# Patient Record
Sex: Male | Born: 1970 | Race: White | Hispanic: No | Marital: Single | Smoking: Current every day smoker
Health system: Southern US, Community
[De-identification: ages and names within clinical notes are randomized; demographics above are authoritative.]

## PROBLEM LIST (undated history)

## (undated) ENCOUNTER — Telehealth

## (undated) ENCOUNTER — Encounter

## (undated) ENCOUNTER — Telehealth: Attending: Clinical | Primary: Clinical

## (undated) ENCOUNTER — Ambulatory Visit: Payer: MEDICAID | Attending: Family | Primary: Family

## (undated) ENCOUNTER — Ambulatory Visit

## (undated) ENCOUNTER — Ambulatory Visit: Payer: MEDICAID

## (undated) ENCOUNTER — Encounter: Attending: Sports Medicine | Primary: Sports Medicine

## (undated) ENCOUNTER — Ambulatory Visit: Payer: MEDICAID | Attending: Sports Medicine | Primary: Sports Medicine

## (undated) ENCOUNTER — Telehealth
Attending: Pharmacist Clinician (PhC)/ Clinical Pharmacy Specialist | Primary: Pharmacist Clinician (PhC)/ Clinical Pharmacy Specialist

## (undated) ENCOUNTER — Encounter: Attending: Family | Primary: Family

## (undated) ENCOUNTER — Encounter: Attending: Physical Medicine & Rehabilitation | Primary: Physical Medicine & Rehabilitation

## (undated) ENCOUNTER — Telehealth: Attending: Physical Medicine & Rehabilitation | Primary: Physical Medicine & Rehabilitation

## (undated) ENCOUNTER — Ambulatory Visit
Attending: Pharmacist Clinician (PhC)/ Clinical Pharmacy Specialist | Primary: Pharmacist Clinician (PhC)/ Clinical Pharmacy Specialist

## (undated) ENCOUNTER — Telehealth: Attending: Sports Medicine | Primary: Sports Medicine

## (undated) ENCOUNTER — Telehealth: Attending: Family | Primary: Family

## (undated) ENCOUNTER — Ambulatory Visit: Payer: MEDICAID | Attending: Gastroenterology | Primary: Gastroenterology

## (undated) ENCOUNTER — Ambulatory Visit: Payer: MEDICAID | Attending: Family Medicine | Primary: Family Medicine

## (undated) DIAGNOSIS — J449 Chronic obstructive pulmonary disease, unspecified: Secondary | ICD-10-CM

## (undated) DIAGNOSIS — F209 Schizophrenia, unspecified: Secondary | ICD-10-CM

## (undated) DIAGNOSIS — M542 Cervicalgia: Secondary | ICD-10-CM

## (undated) DIAGNOSIS — F419 Anxiety disorder, unspecified: Secondary | ICD-10-CM

## (undated) DIAGNOSIS — F39 Unspecified mood [affective] disorder: Secondary | ICD-10-CM

## (undated) DIAGNOSIS — F1320 Sedative, hypnotic or anxiolytic dependence, uncomplicated: Secondary | ICD-10-CM

## (undated) DIAGNOSIS — K219 Gastro-esophageal reflux disease without esophagitis: Secondary | ICD-10-CM

## (undated) DIAGNOSIS — G894 Chronic pain syndrome: Secondary | ICD-10-CM

## (undated) DIAGNOSIS — F329 Major depressive disorder, single episode, unspecified: Secondary | ICD-10-CM

## (undated) DIAGNOSIS — F4310 Post-traumatic stress disorder, unspecified: Secondary | ICD-10-CM

## (undated) DIAGNOSIS — D1030 Benign neoplasm of unspecified part of mouth: Secondary | ICD-10-CM

## (undated) DIAGNOSIS — L905 Scar conditions and fibrosis of skin: Secondary | ICD-10-CM

## (undated) DIAGNOSIS — I10 Essential (primary) hypertension: Secondary | ICD-10-CM

## (undated) DIAGNOSIS — H5210 Myopia, unspecified eye: Secondary | ICD-10-CM

## (undated) DIAGNOSIS — F1210 Cannabis abuse, uncomplicated: Secondary | ICD-10-CM

## (undated) DIAGNOSIS — H52229 Regular astigmatism, unspecified eye: Secondary | ICD-10-CM

## (undated) DIAGNOSIS — F319 Bipolar disorder, unspecified: Secondary | ICD-10-CM

## (undated) DIAGNOSIS — M47812 Spondylosis without myelopathy or radiculopathy, cervical region: Secondary | ICD-10-CM

## (undated) DIAGNOSIS — T50902A Poisoning by unspecified drugs, medicaments and biological substances, intentional self-harm, initial encounter: Secondary | ICD-10-CM

## (undated) DIAGNOSIS — F1410 Cocaine abuse, uncomplicated: Secondary | ICD-10-CM

## (undated) HISTORY — DX: Scar conditions and fibrosis of skin: L90.5

## (undated) HISTORY — DX: Benign neoplasm of unspecified part of mouth: D10.30

## (undated) HISTORY — DX: Chronic pain syndrome: G89.4

## (undated) HISTORY — DX: Gastro-esophageal reflux disease without esophagitis: K21.9

## (undated) HISTORY — PX: HERNIA REPAIR: SHX51

## (undated) HISTORY — DX: Chronic obstructive pulmonary disease, unspecified: J44.9

## (undated) HISTORY — DX: Cocaine abuse, uncomplicated: F14.10

## (undated) HISTORY — DX: Cannabis abuse, uncomplicated: F12.10

## (undated) HISTORY — DX: Spondylosis without myelopathy or radiculopathy, cervical region: M47.812

## (undated) HISTORY — DX: Major depressive disorder, single episode, unspecified: F32.9

## (undated) HISTORY — DX: Cervicalgia: M54.2

## (undated) HISTORY — DX: Myopia, unspecified eye: H52.10

## (undated) HISTORY — DX: Poisoning by unspecified drugs, medicaments and biological substances, intentional self-harm, initial encounter: T50.902A

## (undated) HISTORY — PX: ARM DEBRIDEMENT: SHX890

## (undated) HISTORY — DX: Unspecified mood (affective) disorder: F39

## (undated) HISTORY — DX: Sedative, hypnotic or anxiolytic dependence, uncomplicated: F13.20

## (undated) HISTORY — DX: Regular astigmatism, unspecified eye: H52.229

## (undated) HISTORY — PX: TYMPANOPLASTY: SHX33

---
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
## 2005-02-13 DIAGNOSIS — L905 Scar conditions and fibrosis of skin: Secondary | ICD-10-CM

## 2005-02-13 HISTORY — DX: Scar conditions and fibrosis of skin: L90.5

## 2010-12-12 DIAGNOSIS — H5210 Myopia, unspecified eye: Secondary | ICD-10-CM

## 2010-12-12 DIAGNOSIS — H52229 Regular astigmatism, unspecified eye: Secondary | ICD-10-CM

## 2010-12-12 HISTORY — DX: Myopia, unspecified eye: H52.10

## 2010-12-12 HISTORY — DX: Regular astigmatism, unspecified eye: H52.229

## 2011-11-03 ENCOUNTER — Emergency Department: Payer: Self-pay | Admitting: Emergency Medicine

## 2011-11-03 LAB — DRUG SCREEN, URINE
Amphetamines, Ur Screen: NEGATIVE (ref ?–1000)
Barbiturates, Ur Screen: NEGATIVE (ref ?–200)
Benzodiazepine, Ur Scrn: NEGATIVE (ref ?–200)
Cannabinoid 50 Ng, Ur ~~LOC~~: POSITIVE (ref ?–50)
MDMA (Ecstasy)Ur Screen: NEGATIVE (ref ?–500)
Methadone, Ur Screen: NEGATIVE (ref ?–300)
Opiate, Ur Screen: NEGATIVE (ref ?–300)
Tricyclic, Ur Screen: POSITIVE (ref ?–1000)

## 2011-11-03 LAB — CBC
HCT: 41.4 % (ref 40.0–52.0)
Platelet: 230 10*3/uL (ref 150–440)
RBC: 4.48 10*6/uL (ref 4.40–5.90)
RDW: 13 % (ref 11.5–14.5)
WBC: 17 10*3/uL — ABNORMAL HIGH (ref 3.8–10.6)

## 2011-11-03 LAB — TSH: Thyroid Stimulating Horm: 2.13 u[IU]/mL

## 2011-11-03 LAB — COMPREHENSIVE METABOLIC PANEL
Albumin: 4.1 g/dL (ref 3.4–5.0)
Alkaline Phosphatase: 83 U/L (ref 50–136)
BUN: 15 mg/dL (ref 7–18)
Bilirubin,Total: 0.6 mg/dL (ref 0.2–1.0)
Creatinine: 1.35 mg/dL — ABNORMAL HIGH (ref 0.60–1.30)
EGFR (Non-African Amer.): >60
Glucose: 108 mg/dL — ABNORMAL HIGH (ref 65–99)
Osmolality: 271 (ref 275–301)
SGOT(AST): 60 U/L — ABNORMAL HIGH (ref 15–37)
SGPT (ALT): 91 U/L — ABNORMAL HIGH (ref 12–78)
Total Protein: 8.5 g/dL — ABNORMAL HIGH (ref 6.4–8.2)

## 2011-11-03 LAB — ACETAMINOPHEN LEVEL: Acetaminophen: <2 ug/mL

## 2011-12-11 DIAGNOSIS — D1030 Benign neoplasm of unspecified part of mouth: Secondary | ICD-10-CM

## 2011-12-11 HISTORY — DX: Benign neoplasm of unspecified part of mouth: D10.30

## 2012-02-25 ENCOUNTER — Emergency Department: Payer: Self-pay | Admitting: Emergency Medicine

## 2012-02-28 ENCOUNTER — Emergency Department: Payer: Self-pay | Admitting: Emergency Medicine

## 2012-03-04 ENCOUNTER — Emergency Department: Payer: Self-pay | Admitting: Emergency Medicine

## 2012-03-12 ENCOUNTER — Emergency Department: Payer: Self-pay | Admitting: Emergency Medicine

## 2012-03-29 ENCOUNTER — Emergency Department: Payer: Self-pay | Admitting: Emergency Medicine

## 2012-04-09 DIAGNOSIS — D1030 Benign neoplasm of unspecified part of mouth: Secondary | ICD-10-CM

## 2012-04-09 HISTORY — DX: Benign neoplasm of unspecified part of mouth: D10.30

## 2012-05-12 DIAGNOSIS — K219 Gastro-esophageal reflux disease without esophagitis: Secondary | ICD-10-CM

## 2012-05-12 HISTORY — DX: Gastro-esophageal reflux disease without esophagitis: K21.9

## 2012-12-19 DIAGNOSIS — J449 Chronic obstructive pulmonary disease, unspecified: Secondary | ICD-10-CM

## 2012-12-19 DIAGNOSIS — Z720 Tobacco use: Secondary | ICD-10-CM | POA: Insufficient documentation

## 2012-12-19 HISTORY — DX: Chronic obstructive pulmonary disease, unspecified: J44.9

## 2013-05-07 DIAGNOSIS — M47812 Spondylosis without myelopathy or radiculopathy, cervical region: Secondary | ICD-10-CM | POA: Insufficient documentation

## 2013-05-07 DIAGNOSIS — M542 Cervicalgia: Secondary | ICD-10-CM

## 2013-05-07 HISTORY — DX: Spondylosis without myelopathy or radiculopathy, cervical region: M47.812

## 2013-05-07 HISTORY — DX: Cervicalgia: M54.2

## 2013-06-03 DIAGNOSIS — F1410 Cocaine abuse, uncomplicated: Secondary | ICD-10-CM

## 2013-06-03 HISTORY — DX: Cocaine abuse, uncomplicated: F14.10

## 2013-06-29 ENCOUNTER — Emergency Department: Payer: Self-pay | Admitting: Emergency Medicine

## 2013-06-29 LAB — BASIC METABOLIC PANEL
ANION GAP: 5 — AB (ref 7–16)
BUN: 6 mg/dL — ABNORMAL LOW (ref 7–18)
CALCIUM: 8 mg/dL — AB (ref 8.5–10.1)
CHLORIDE: 100 mmol/L (ref 98–107)
Co2: 28 mmol/L (ref 21–32)
Creatinine: 1.11 mg/dL (ref 0.60–1.30)
EGFR (African American): >60
EGFR (Non-African Amer.): >60
Glucose: 118 mg/dL — ABNORMAL HIGH (ref 65–99)
Osmolality: 265 (ref 275–301)
POTASSIUM: 3.8 mmol/L (ref 3.5–5.1)
Sodium: 133 mmol/L — ABNORMAL LOW (ref 136–145)

## 2013-06-29 LAB — CBC
HCT: 37 % — ABNORMAL LOW (ref 40.0–52.0)
HGB: 12.4 g/dL — AB (ref 13.0–18.0)
MCH: 31.4 pg (ref 26.0–34.0)
MCHC: 33.5 g/dL (ref 32.0–36.0)
MCV: 94 fL (ref 80–100)
Platelet: 181 10*3/uL (ref 150–440)
RBC: 3.95 10*6/uL — ABNORMAL LOW (ref 4.40–5.90)
RDW: 12.5 % (ref 11.5–14.5)
WBC: 6.8 10*3/uL (ref 3.8–10.6)

## 2013-06-29 LAB — TROPONIN I
Troponin-I: <0.02 ng/mL
Troponin-I: <0.02 ng/mL

## 2013-07-26 ENCOUNTER — Emergency Department: Payer: Self-pay | Admitting: Emergency Medicine

## 2013-09-13 ENCOUNTER — Emergency Department: Payer: Self-pay | Admitting: Emergency Medicine

## 2013-09-23 ENCOUNTER — Emergency Department: Payer: Self-pay | Admitting: Emergency Medicine

## 2013-09-25 ENCOUNTER — Emergency Department: Payer: Self-pay | Admitting: Emergency Medicine

## 2013-10-10 DIAGNOSIS — F39 Unspecified mood [affective] disorder: Secondary | ICD-10-CM | POA: Insufficient documentation

## 2013-10-10 HISTORY — DX: Unspecified mood (affective) disorder: F39

## 2013-10-16 DIAGNOSIS — F1320 Sedative, hypnotic or anxiolytic dependence, uncomplicated: Secondary | ICD-10-CM

## 2013-10-16 DIAGNOSIS — F1994 Other psychoactive substance use, unspecified with psychoactive substance-induced mood disorder: Secondary | ICD-10-CM | POA: Insufficient documentation

## 2013-10-16 DIAGNOSIS — F1210 Cannabis abuse, uncomplicated: Secondary | ICD-10-CM

## 2013-10-16 HISTORY — DX: Cannabis abuse, uncomplicated: F12.10

## 2013-10-16 HISTORY — DX: Sedative, hypnotic or anxiolytic dependence, uncomplicated: F13.20

## 2013-10-20 ENCOUNTER — Emergency Department: Payer: Self-pay | Admitting: Emergency Medicine

## 2014-02-16 DIAGNOSIS — T50902A Poisoning by unspecified drugs, medicaments and biological substances, intentional self-harm, initial encounter: Secondary | ICD-10-CM

## 2014-02-16 HISTORY — DX: Poisoning by unspecified drugs, medicaments and biological substances, intentional self-harm, initial encounter: T50.902A

## 2014-03-17 ENCOUNTER — Emergency Department: Payer: Self-pay | Admitting: Emergency Medicine

## 2014-05-20 ENCOUNTER — Emergency Department
Admission: EM | Admit: 2014-05-20 | Discharge: 2014-05-21 | Disposition: A | Discharge status: Home / Self Care | Payer: Medicaid Other | Attending: Emergency Medicine | Admitting: Emergency Medicine

## 2014-05-20 ENCOUNTER — Emergency Department: Payer: Medicaid Other

## 2014-05-20 ENCOUNTER — Encounter: Payer: Self-pay | Admitting: Emergency Medicine

## 2014-05-20 DIAGNOSIS — I10 Essential (primary) hypertension: Secondary | ICD-10-CM | POA: Insufficient documentation

## 2014-05-20 DIAGNOSIS — Z720 Tobacco use: Secondary | ICD-10-CM | POA: Diagnosis not present

## 2014-05-20 DIAGNOSIS — K5900 Constipation, unspecified: Secondary | ICD-10-CM | POA: Diagnosis not present

## 2014-05-20 HISTORY — DX: Schizophrenia, unspecified: F20.9

## 2014-05-20 HISTORY — DX: Bipolar disorder, unspecified: F31.9

## 2014-05-20 HISTORY — DX: Anxiety disorder, unspecified: F41.9

## 2014-05-20 HISTORY — DX: Essential (primary) hypertension: I10

## 2014-05-20 NOTE — ED Notes (Signed)
Pt presents to ED with c/o constipation. Pt states he hasn't been able to produce a bowel movement in about a week. Pt states he feels like his "belly is going to blow up" and has generalized abd discomfort. Hx of the same. Pt states he has taken miralax and stool softeners at home with no relief. Denies vomiting. Pt currently has no increased work of breathing or acute distress noted at this time.

## 2014-05-21 MED ORDER — POLYETHYLENE GLYCOL 3350 17 G PO PACK
17.0000 g | PACK | Freq: Every day | ORAL | Status: DC
  Administered 2014-05-21: 17 g via ORAL
  Filled 2014-05-21: qty 1

## 2014-05-21 MED ORDER — FLEET ENEMA 7-19 GM/118ML RE ENEM
1.0000 | ENEMA | Freq: Once | RECTAL | Status: AC
  Administered 2014-05-21: 1 via RECTAL

## 2014-05-21 MED ORDER — MAGNESIUM CITRATE PO SOLN
1.0000 | Freq: Once | ORAL | Status: AC
  Administered 2014-05-21: 1 via ORAL

## 2014-05-21 MED ORDER — MAGNESIUM CITRATE PO SOLN
ORAL | Status: AC
  Administered 2014-05-21: 1 via ORAL
  Filled 2014-05-21: qty 296

## 2014-05-21 NOTE — Discharge Instructions (Signed)
Constipation  Constipation is when a person:  · Poops (has a bowel movement) less than 3 times a week.  · Has a hard time pooping.  · Has poop that is dry, hard, or bigger than normal.  HOME CARE   · Eat foods with a lot of fiber in them. This includes fruits, vegetables, beans, and whole grains such as Bodhi Stenglein rice.  · Avoid fatty foods and foods with a lot of sugar. This includes french fries, hamburgers, cookies, candy, and soda.  · If you are not getting enough fiber from food, take products with added fiber in them (supplements).  · Drink enough fluid to keep your pee (urine) clear or pale yellow.  · Exercise on a regular basis, or as told by your doctor.  · Go to the restroom when you feel like you need to poop. Do not hold it.  · Only take medicine as told by your doctor. Do not take medicines that help you poop (laxatives) without talking to your doctor first.  GET HELP RIGHT AWAY IF:   · You have bright red blood in your poop (stool).  · Your constipation lasts more than 4 days or gets worse.  · You have belly (abdominal) or butt (rectal) pain.  · You have thin poop (as thin as a pencil).  · You lose weight, and it cannot be explained.  MAKE SURE YOU:   · Understand these instructions.  · Will watch your condition.  · Will get help right away if you are not doing well or get worse.  Document Released: 06/06/2007 Document Revised: 12/23/2012 Document Reviewed: 09/29/2012  Exit[employer]® Patient Information ©2015 [employer]. This information is not intended to replace advice given to you by your health care provider. Make sure you discuss any questions you have with your health care provider.

## 2014-05-21 NOTE — ED Notes (Signed)
   05/21/14 0004  Abdominal  Gastrointestinal (WDL) X  Tenderness Nontender  Last Bowel Movement Date 05/13/14  Pt reports constipation w/o relief of stool softeners and Miralax . Pt denies pain.

## 2014-05-21 NOTE — ED Provider Notes (Signed)
[facility] Emergency Department Provider Note  ____________________________________________  Time seen: 11:55 PM  I have reviewed the triage vital signs and the nursing notes.   HISTORY  Chief Complaint Constipation      HPI Derrick Maldonado is a 44 y.o. male presents with constipation 1 week. Patient admits to bloating sensation to the abdomen. Denies vomiting no history of abdominal surgery. Patient does admit to previous history of constipation of note patient does take oxycodone for pain at home.     Past Medical History  Diagnosis Date  . Hypertension   . Bipolar disorder   . Schizophrenia   . Anxiety     There are no active problems to display for this patient.   Past Surgical History  Procedure Laterality Date  . Tympanoplasty    . Arm debridement      No current outpatient prescriptions on file.  Allergies Review of patient's allergies indicates no known allergies.  No family history on file.  Social History History  Substance Use Topics  . Smoking status: Current Every Day Smoker -- 1.00 packs/day    Types: Cigarettes  . Smokeless tobacco: Never Used  . Alcohol Use: No    Review of Systems  Constitutional: Negative for fever. Eyes: Negative for visual changes. ENT: Negative for sore throat. Cardiovascular: Negative for chest pain. Respiratory: Negative for shortness of breath. Gastrointestinal: Negative for abdominal pain, vomiting and diarrhea. Positive constipation Genitourinary: Negative for dysuria. Musculoskeletal: Negative for back pain. Skin: Negative for rash. Neurological: Negative for headaches, focal weakness or numbness.   10-point ROS otherwise negative.  ____________________________________________   PHYSICAL EXAM:  VITAL SIGNS: ED Triage Vitals  Enc Vitals Group     BP 05/20/14 2118 124/100 mmHg     Pulse Rate 05/20/14 2118 71     Resp 05/20/14 2118 20     Temp 05/20/14 2118 98.4 F  (36.9 C)     Temp Source 05/20/14 2118 Oral     SpO2 05/20/14 2118 98 %     Weight 05/20/14 2118 204 lb (92.534 kg)     Height 05/20/14 2118 5\' 10"  (1.778 m)     Head Cir --      Peak Flow --      Pain Score 05/20/14 2119 7     Pain Loc --      Pain Edu? --      Excl. in [place]? --      Constitutional: Alert and oriented. Well appearing and in no distress. Eyes: Conjunctivae are normal. PERRL. Normal extraocular movements. ENT   Head: Normocephalic and atraumatic.   Nose: No congestion/rhinnorhea.   Mouth/Throat: Mucous membranes are moist.   Neck: No stridor. Cardiovascular: Normal rate, regular rhythm. Normal and symmetric distal pulses are present in all extremities. No murmurs, rubs, or gallops. Respiratory: Normal respiratory effort without tachypnea nor retractions. Breath sounds are clear and equal bilaterally. No wheezes/rales/rhonchi. Gastrointestinal: Soft and nontender. No distention. There is no CVA tenderness. Genitourinary: deferred Musculoskeletal: Nontender with normal range of motion in all extremities. No joint effusions.  No lower extremity tenderness nor edema. Neurologic:  Normal speech and language. No gross focal neurologic deficits are appreciated. Speech is normal.  Skin:  Skin is warm, dry and intact. No rash noted. Psychiatric: Mood and affect are normal. Speech and behavior are normal. Patient exhibits appropriate insight and judgment.  ____________________________________________           RADIOLOGY  Abdominal x-ray revealed stool in the proximal  colon.  ____________________________________________    INITIAL IMPRESSION / ASSESSMENT AND PLAN / ED COURSE  Pertinent labs & imaging results that were available during my care of the patient were reviewed by me and considered in my medical decision making (see chart for details).  History of physical exam consistent with constipation considered obstruction however unlikely normal  bowel sounds on exam no abdominal pain elicited on exam and no previous history of abdominal surgery.  ____________________________________________   FINAL CLINICAL IMPRESSION(S) / ED DIAGNOSES  Final diagnoses:  Constipation      Gregor Hams, MD 05/26/14 [phone]

## 2014-06-17 ENCOUNTER — Encounter: Payer: Self-pay | Admitting: Emergency Medicine

## 2014-06-17 ENCOUNTER — Emergency Department
Admission: EM | Admit: 2014-06-17 | Discharge: 2014-06-17 | Disposition: A | Discharge status: Home / Self Care | Payer: Medicaid Other | Attending: Emergency Medicine | Admitting: Emergency Medicine

## 2014-06-17 DIAGNOSIS — Z9889 Other specified postprocedural states: Secondary | ICD-10-CM

## 2014-06-17 DIAGNOSIS — Z720 Tobacco use: Secondary | ICD-10-CM | POA: Insufficient documentation

## 2014-06-17 DIAGNOSIS — R070 Pain in throat: Secondary | ICD-10-CM | POA: Diagnosis not present

## 2014-06-17 DIAGNOSIS — J029 Acute pharyngitis, unspecified: Secondary | ICD-10-CM | POA: Diagnosis present

## 2014-06-17 DIAGNOSIS — I10 Essential (primary) hypertension: Secondary | ICD-10-CM | POA: Insufficient documentation

## 2014-06-17 NOTE — ED Notes (Signed)
Not in room for discharge instructions   

## 2014-06-17 NOTE — ED Notes (Signed)
Pt states he had an endoscopy last week, still having burning in throat.  States he ran out of percocet and would like more. No resp distress

## 2014-06-17 NOTE — Discharge Instructions (Signed)
Salt Water Gargle This solution will help make your mouth and throat feel better. HOME CARE INSTRUCTIONS   Mix 1 teaspoon of salt in 8 ounces of warm water.  Gargle with this solution as much or often as you need or as directed. Swish and gargle gently if you have any sores or wounds in your mouth.  Do not swallow this mixture. Document Released: 09/22/2003 Document Revised: 03/12/2011 Document Reviewed: 02/13/2008 [facility] Patient Information 2015 [place], [place]. This information is not intended to replace advice given to you by your health care provider. Make sure you discuss any questions you have with your health care provider.  Follow-up with your oral surgeon as scheduled on Monday, June 21, 2014 for pain management.  Rinse daily with Magic Mouthwash, and dose Tylenol and Motrin for pain relief.

## 2014-06-17 NOTE — ED Provider Notes (Signed)
[facility] Emergency Department Provider Note ____________________________________________  Time seen: 0939  I have reviewed the triage vital signs and the nursing notes.  HISTORY  Chief Complaint  Sore Throat  HPI Derrick Maldonado is a 44 y.o. 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 He was prescribed Percocet 5-3 25, #20; and is here requesting a refill. He is claiming he dosed his last tab yesterday, and continues to have throat burning. He denies coughing or spitting up blood, difficulty eating or drinking, or nausea & vomiting. 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   Past Medical History  Diagnosis Date  . Hypertension   . Bipolar disorder   . Schizophrenia   . Anxiety    There are no active problems to display for this patient.  Past Surgical History  Procedure Laterality Date  . Tympanoplasty    . Arm debridement     No current outpatient prescriptions on file.  Allergies Review of patient's allergies indicates no known allergies.  No family history on file.  Social History History  Substance Use Topics  . Smoking status: Current Every Day Smoker -- 1.00 packs/day    Types: Cigarettes  . Smokeless tobacco: Never Used  . Alcohol Use: No   Review of Systems  Constitutional: Negative for fever, chill, sweats. Eyes: Negative for visual changes. ENT: Positive for sore throat. Cardiovascular: Negative for chest pain. Respiratory: Negative for shortness of breath. Gastrointestinal: Negative for abdominal pain, vomiting and diarrhea. Genitourinary: Negative for dysuria. Musculoskeletal: Negative for back pain. Skin: Negative for rash. Neurological: Negative for headaches, focal weakness or numbness. ____________________________________________  PHYSICAL EXAM:  VITAL SIGNS: ED  Triage Vitals  Enc Vitals Group     BP 06/17/14 0914 126/90 mmHg     Pulse Rate 06/17/14 0914 100     Resp 06/17/14 0914 20     Temp 06/17/14 0914 98.1 F (36.7 C)     Temp Source 06/17/14 0914 Oral     SpO2 06/17/14 0914 98 %     Weight 06/17/14 0914 190 lb (86.183 kg)     Height 06/17/14 0914 5\' 9"  (1.753 m)     Head Cir --      Peak Flow --      Pain Score 06/17/14 0909 9     Pain Loc --      Pain Edu? --      Excl. in [place]? --    Constitutional: Alert and oriented. Well appearing and in no distress. Eyes: Conjunctivae are normal. PERRL. Normal extraocular movements. ENT   Head: Normocephalic and atraumatic.   Nose: No congestion/rhinnorhea.   Mouth/Throat: Mucous membranes are moist. Uvula midline, tongue without lesions, no thrush.    Neck: Supple. No thyromegaly. Hematological/Lymphatic/Immunilogical: No cervical lymphadenopathy. Cardiovascular: Normal rate, regular rhythm.  Respiratory: Normal respiratory effort. No wheezes/rales/rhonchi. Musculoskeletal: Nontender with normal range of motion in all extremities.  Neurologic:  Normal gait without ataxia. Normal speech and language. No gross focal neurologic deficits are appreciated. Skin:  Skin is warm, dry and intact. No rash noted. Psychiatric: Mood and affect are normal. Patient exhibits appropriate insight and judgment. _______________________________________________________  INITIAL IMPRESSION / ASSESSMENT AND PLAN / ED COURSE  Patient requesting pain medicine refill following vocal cord biopsy nearly 10 days earlier by [facility].  Patient offered multiple, non-narcotic pain medicine options, but declined.  Referred back to [facility], as scheduled, for follow-up on Monday, June 21, 2014.  ____________________________________________  FINAL CLINICAL IMPRESSION(S) / ED DIAGNOSES  Final diagnoses:  Throat pain in adult  History of biopsy     Melvenia Needles, PA-C 06/17/14 1601  Delman Kitten, MD 06/18/14  1303

## 2014-06-26 ENCOUNTER — Emergency Department: Payer: Medicaid Other

## 2014-06-26 ENCOUNTER — Encounter: Payer: Self-pay | Admitting: Emergency Medicine

## 2014-06-26 ENCOUNTER — Emergency Department
Admission: EM | Admit: 2014-06-26 | Discharge: 2014-06-26 | Disposition: A | Discharge status: Home / Self Care | Payer: Medicaid Other | Attending: Emergency Medicine | Admitting: Emergency Medicine

## 2014-06-26 DIAGNOSIS — Z720 Tobacco use: Secondary | ICD-10-CM | POA: Diagnosis not present

## 2014-06-26 DIAGNOSIS — Y998 Other external cause status: Secondary | ICD-10-CM | POA: Insufficient documentation

## 2014-06-26 DIAGNOSIS — Y9389 Activity, other specified: Secondary | ICD-10-CM | POA: Diagnosis not present

## 2014-06-26 DIAGNOSIS — Z79899 Other long term (current) drug therapy: Secondary | ICD-10-CM | POA: Insufficient documentation

## 2014-06-26 DIAGNOSIS — I10 Essential (primary) hypertension: Secondary | ICD-10-CM | POA: Insufficient documentation

## 2014-06-26 DIAGNOSIS — Y92410 Unspecified street and highway as the place of occurrence of the external cause: Secondary | ICD-10-CM | POA: Diagnosis not present

## 2014-06-26 DIAGNOSIS — S199XXA Unspecified injury of neck, initial encounter: Secondary | ICD-10-CM | POA: Diagnosis present

## 2014-06-26 DIAGNOSIS — S161XXA Strain of muscle, fascia and tendon at neck level, initial encounter: Secondary | ICD-10-CM | POA: Diagnosis not present

## 2014-06-26 MED ORDER — DIAZEPAM 5 MG PO TABS
ORAL_TABLET | ORAL | Status: AC
  Administered 2014-06-26: 5 mg via ORAL
  Filled 2014-06-26: qty 1

## 2014-06-26 MED ORDER — KETOROLAC TROMETHAMINE 60 MG/2ML IM SOLN
INTRAMUSCULAR | Status: AC
  Administered 2014-06-26: 60 mg via INTRAMUSCULAR
  Filled 2014-06-26: qty 2

## 2014-06-26 MED ORDER — CYCLOBENZAPRINE HCL 10 MG PO TABS: 10.0000 mg | ORAL_TABLET | Freq: Three times a day (TID) | ORAL | Status: DC | PRN

## 2014-06-26 MED ORDER — IBUPROFEN 800 MG PO TABS: 800.0000 mg | ORAL_TABLET | Freq: Three times a day (TID) | ORAL | Status: DC | PRN

## 2014-06-26 MED ORDER — KETOROLAC TROMETHAMINE 60 MG/2ML IM SOLN
60.0000 mg | Freq: Once | INTRAMUSCULAR | Status: AC
  Administered 2014-06-26: 60 mg via INTRAMUSCULAR

## 2014-06-26 MED ORDER — HYDROCODONE-ACETAMINOPHEN 5-325 MG PO TABS: 1.0000 | ORAL_TABLET | ORAL | Status: DC | PRN

## 2014-06-26 MED ORDER — DIAZEPAM 5 MG PO TABS
5.0000 mg | ORAL_TABLET | Freq: Once | ORAL | Status: AC
  Administered 2014-06-26: 5 mg via ORAL
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
## 2014-06-26 NOTE — Discharge Instructions (Signed)

## 2014-07-08 ENCOUNTER — Emergency Department
Admission: EM | Admit: 2014-07-08 | Discharge: 2014-07-08 | Disposition: A | Discharge status: Home / Self Care | Payer: Medicaid Other | Attending: Emergency Medicine | Admitting: Emergency Medicine

## 2014-07-08 ENCOUNTER — Encounter: Payer: Self-pay | Admitting: Emergency Medicine

## 2014-07-08 DIAGNOSIS — R070 Pain in throat: Secondary | ICD-10-CM | POA: Diagnosis not present

## 2014-07-08 DIAGNOSIS — K1379 Other lesions of oral mucosa: Secondary | ICD-10-CM | POA: Diagnosis present

## 2014-07-08 DIAGNOSIS — Z79899 Other long term (current) drug therapy: Secondary | ICD-10-CM | POA: Insufficient documentation

## 2014-07-08 DIAGNOSIS — Z87891 Personal history of nicotine dependence: Secondary | ICD-10-CM | POA: Diagnosis not present

## 2014-07-08 DIAGNOSIS — I10 Essential (primary) hypertension: Secondary | ICD-10-CM | POA: Insufficient documentation

## 2014-07-08 HISTORY — DX: Post-traumatic stress disorder, unspecified: F43.10

## 2014-07-08 MED ORDER — LIDOCAINE VISCOUS 2 % MT SOLN: 20.0000 mL | OROMUCOSAL | Status: DC | PRN

## 2014-07-08 MED ORDER — IBUPROFEN 800 MG PO TABS: 800.0000 mg | ORAL_TABLET | Freq: Three times a day (TID) | ORAL | Status: DC | PRN

## 2014-07-08 MED ORDER — HYDROCODONE-ACETAMINOPHEN 5-325 MG PO TABS: 1.0000 | ORAL_TABLET | ORAL | Status: DC | PRN

## 2014-07-08 NOTE — ED Notes (Signed)
Patient states, "I have cancer bumps on my tongue". States he has had these previously and told they are benign but will progress if not treated.

## 2014-07-08 NOTE — ED Provider Notes (Signed)
[facility] Emergency Department Provider Note  ____________________________________________  Time seen: Approximately 10:23 AM  I have reviewed the triage vital signs and the nursing notes.   HISTORY  Chief Complaint Mouth Lesions    HPI Derrick Maldonado is a 44 y.o. male who presents for evaluation states, "I have cancer bumps on my tongue".Patient states she is currently being followed by [facility]NT and [facility] and cannot get back until November. Patient was seen here on June 17 for the same. Requesting refill on Percocet and viscous lidocaine. He denies any coughing or spitting up blood. No difficulty eating or drinking. Denies any nausea or vomiting. Continues to smoke.   Past Medical History  Diagnosis Date  . Hypertension   . Bipolar disorder   . Schizophrenia   . Anxiety   . PTSD (post-traumatic stress disorder)     There are no active problems to display for this patient.   Past Surgical History  Procedure Laterality Date  . Tympanoplasty    . Arm debridement      Current Outpatient Rx  Name  Route  Sig  Dispense  Refill  . escitalopram (LEXAPRO) 20 MG tablet   Oral   Take 20 mg by mouth daily.         Marland Kitchen HYDROcodone-acetaminophen (NORCO) 5-325 MG per tablet   Oral   Take 1-2 tablets by mouth every 4 (four) hours as needed for moderate pain.   8 tablet   0   . hydrOXYzine (ATARAX/VISTARIL) 25 MG tablet   Oral   Take 25 mg by mouth 3 (three) times daily as needed.         Marland Kitchen ibuprofen (ADVIL,MOTRIN) 800 MG tablet   Oral   Take 1 tablet (800 mg total) by mouth every 8 (eight) hours as needed.   30 tablet   0   . lidocaine (XYLOCAINE) 2 % solution   Mouth/Throat   Use as directed 20 mLs in the mouth or throat as needed for mouth pain.   100 mL   0   . ziprasidone (GEODON) 40 MG capsule   Oral   Take 40 mg by mouth 2 (two) times daily with a meal.           Allergies Review of patient's allergies indicates no known  allergies.  No family history on file.  Social History History  Substance Use Topics  . Smoking status: Former Smoker -- 1.00 packs/day    Types: Cigarettes  . Smokeless tobacco: Never Used  . Alcohol Use: No    Review of Systems Constitutional: No fever/chills Eyes: No visual changes. ENT: Positive bumps on posterior tongue and sore throat. Cardiovascular: Denies chest pain. Respiratory: Denies shortness of breath. Gastrointestinal: No abdominal pain.  No nausea, no vomiting.  No diarrhea.  No constipation. Genitourinary: Negative for dysuria. Musculoskeletal: Negative for back pain. Skin: Negative for rash. Neurological: Negative for headaches, focal weakness or numbness.  10-point ROS otherwise negative.  ____________________________________________   PHYSICAL EXAM:  VITAL SIGNS: ED Triage Vitals  Enc Vitals Group     BP 07/08/14 0954 136/96 mmHg     Pulse Rate 07/08/14 0954 92     Resp 07/08/14 0954 14     Temp 07/08/14 0954 97.8 F (36.6 C)     Temp Source 07/08/14 0954 Oral     SpO2 07/08/14 0954 98 %     Weight 07/08/14 0954 208 lb (94.348 kg)     Height 07/08/14 0954 5\' 10"  (1.778  m)     Head Cir --      Peak Flow --      Pain Score 07/08/14 0954 8     Pain Loc --      Pain Edu? --      Excl. in [place]? --     Constitutional: Alert and oriented. Well appearing and in no acute distress. Eyes: Conjunctivae are normal. PERRL. EOMI. Head: Atraumatic. Nose: No congestion/rhinnorhea. Mouth/Throat: Mucous membranes are moist.  Oropharynx non-erythematous. Uvula midline and no thrush. Large bumps noted at the posterior aspect of the tongue. Neck: No stridor.   Cardiovascular: Normal rate, regular rhythm. Grossly normal heart sounds.  Good peripheral circulation. Respiratory: Normal respiratory effort.  No retractions. Lungs CTAB. Musculoskeletal: No lower extremity tenderness nor edema.  No joint effusions. Neurologic:  Normal speech and language. No gross  focal neurologic deficits are appreciated. Speech is normal. No gait instability. Skin:  Skin is warm, dry and intact. No rash noted. Psychiatric: Mood and affect are normal. Speech and behavior are normal.  ____________________________________________   LABS (all labs ordered are listed, but only abnormal results are displayed)  Labs Reviewed - No data to display ____________________________________________   INITIAL IMPRESSION / ASSESSMENT AND PLAN / ED COURSE  Pertinent labs & imaging results that were available during my care of the patient were reviewed by me and considered in my medical decision making (see chart for details).  Patient requesting pain medication refill following diagnosis of oral cancer. He is also requesting a local ENT provider to keep from driving to [facility] every day. Referred to Dr. Tami Ribas for follow-up. ____________________________________________   FINAL CLINICAL IMPRESSION(S) / ED DIAGNOSES  Final diagnoses:  Throat pain in adult      Arlyss Repress, PA-C 07/08/14 1039  Lavonia Drafts, MD 07/08/14 1450

## 2014-07-21 DIAGNOSIS — G894 Chronic pain syndrome: Secondary | ICD-10-CM

## 2014-07-21 HISTORY — DX: Chronic pain syndrome: G89.4

## 2014-08-05 ENCOUNTER — Emergency Department
Admission: EM | Admit: 2014-08-05 | Discharge: 2014-08-05 | Disposition: A | Discharge status: Home / Self Care | Payer: Medicaid Other | Attending: Emergency Medicine | Admitting: Emergency Medicine

## 2014-08-05 ENCOUNTER — Encounter: Payer: Self-pay | Admitting: Emergency Medicine

## 2014-08-05 DIAGNOSIS — J029 Acute pharyngitis, unspecified: Secondary | ICD-10-CM | POA: Diagnosis present

## 2014-08-05 DIAGNOSIS — C140 Malignant neoplasm of pharynx, unspecified: Secondary | ICD-10-CM | POA: Diagnosis not present

## 2014-08-05 DIAGNOSIS — R1310 Dysphagia, unspecified: Secondary | ICD-10-CM | POA: Diagnosis not present

## 2014-08-05 DIAGNOSIS — I10 Essential (primary) hypertension: Secondary | ICD-10-CM | POA: Diagnosis not present

## 2014-08-05 DIAGNOSIS — Z79899 Other long term (current) drug therapy: Secondary | ICD-10-CM | POA: Diagnosis not present

## 2014-08-05 DIAGNOSIS — Z87891 Personal history of nicotine dependence: Secondary | ICD-10-CM | POA: Insufficient documentation

## 2014-08-05 MED ORDER — NAPROXEN 500 MG PO TABS
500.0000 mg | ORAL_TABLET | Freq: Once | ORAL | Status: AC
  Administered 2014-08-05: 500 mg via ORAL

## 2014-08-05 MED ORDER — DEXAMETHASONE SODIUM PHOSPHATE 10 MG/ML IJ SOLN
10.0000 mg | Freq: Once | INTRAMUSCULAR | Status: AC
  Administered 2014-08-05: 10 mg via INTRAMUSCULAR

## 2014-08-05 MED ORDER — LIDOCAINE VISCOUS 2 % MT SOLN
OROMUCOSAL | Status: AC
  Administered 2014-08-05: 15 mL via OROMUCOSAL
  Filled 2014-08-05: qty 15

## 2014-08-05 MED ORDER — LIDOCAINE VISCOUS 2 % MT SOLN
15.0000 mL | Freq: Once | OROMUCOSAL | Status: AC
  Administered 2014-08-05: 15 mL via OROMUCOSAL

## 2014-08-05 MED ORDER — LIDOCAINE VISCOUS 2 % MT SOLN: 20.0000 mL | OROMUCOSAL | Status: DC | PRN

## 2014-08-05 MED ORDER — DEXAMETHASONE SODIUM PHOSPHATE 10 MG/ML IJ SOLN
INTRAMUSCULAR | Status: AC
  Administered 2014-08-05: 10 mg via INTRAMUSCULAR
  Filled 2014-08-05: qty 1

## 2014-08-05 MED ORDER — NAPROXEN 500 MG PO TABS
ORAL_TABLET | ORAL | Status: AC
  Administered 2014-08-05: 500 mg via ORAL
  Filled 2014-08-05: qty 1

## 2014-08-05 MED ORDER — NAPROXEN 500 MG PO TABS: 500.0000 mg | ORAL_TABLET | Freq: Two times a day (BID) | ORAL | Status: DC

## 2014-08-05 NOTE — ED Notes (Signed)
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

## 2014-08-05 NOTE — Discharge Instructions (Signed)

## 2014-08-05 NOTE — ED Provider Notes (Signed)
[facility] Emergency Department Provider Note  ____________________________________________  Time seen: 10:00 PM  I have reviewed the triage vital signs and the nursing notes.   HISTORY  Chief Complaint Sore Throat    HPI Derrick Maldonado is a 44 y.o. male who complains of sore throat for the past 3-4 days. He notes that he has throat cancer normally gets lesions removed every 6 months or so. He was supposed to follow-up on Monday for rescheduling of another procedure, but he had a death in the family and could not attend his medical appointment. No vomiting, no choking, tolerating oral intake.  No fevers or chills. No drooling   Past Medical History  Diagnosis Date  . Hypertension   . Bipolar disorder   . Schizophrenia   . Anxiety   . PTSD (post-traumatic stress disorder)     There are no active problems to display for this patient.   Past Surgical History  Procedure Laterality Date  . Tympanoplasty    . Arm debridement      Current Outpatient Rx  Name  Route  Sig  Dispense  Refill  . escitalopram (LEXAPRO) 20 MG tablet   Oral   Take 20 mg by mouth daily.         Marland Kitchen HYDROcodone-acetaminophen (NORCO) 5-325 MG per tablet   Oral   Take 1-2 tablets by mouth every 4 (four) hours as needed for moderate pain.   8 tablet   0   . hydrOXYzine (ATARAX/VISTARIL) 25 MG tablet   Oral   Take 25 mg by mouth 3 (three) times daily as needed.         Marland Kitchen ibuprofen (ADVIL,MOTRIN) 800 MG tablet   Oral   Take 1 tablet (800 mg total) by mouth every 8 (eight) hours as needed.   30 tablet   0   . lidocaine (XYLOCAINE) 2 % solution   Mouth/Throat   Use as directed 20 mLs in the mouth or throat as needed for mouth pain.   100 mL   0   . lidocaine (XYLOCAINE) 2 % solution   Mouth/Throat   Use as directed 20 mLs in the mouth or throat every 2 (two) hours as needed for mouth pain. Gargle and spit out   100 mL   0   . naproxen (NAPROSYN) 500 MG  tablet   Oral   Take 1 tablet (500 mg total) by mouth 2 (two) times daily with a meal.   20 tablet   0   . ziprasidone (GEODON) 40 MG capsule   Oral   Take 40 mg by mouth 2 (two) times daily with a meal.           Allergies Review of patient's allergies indicates no known allergies.  No family history on file.  Social History History  Substance Use Topics  . Smoking status: Former Smoker -- 1.00 packs/day    Types: Cigarettes  . Smokeless tobacco: Never Used  . Alcohol Use: No   current daily smoker  Review of Systems  Constitutional: No fever or chills. No weight changes Eyes:No blurry vision or double vision.  ENT: Positive sore throat. Cardiovascular: No chest pain. Respiratory: No dyspnea or cough. Gastrointestinal: Negative for abdominal pain, vomiting and diarrhea.  No BRBPR or melena. Genitourinary: Negative for dysuria, urinary retention, bloody urine, or difficulty urinating. Musculoskeletal: Negative for back pain. No joint swelling or pain. Skin: Negative for rash. Neurological: Negative for headaches, focal weakness or numbness. Psychiatric:No anxiety  or depression.   Endocrine:No hot/cold intolerance, changes in energy, or sleep difficulty.  10-point ROS otherwise negative.  ____________________________________________   PHYSICAL EXAM:  VITAL SIGNS: ED Triage Vitals  Enc Vitals Group     BP 08/05/14 2134 143/84 mmHg     Pulse Rate 08/05/14 2134 107     Resp 08/05/14 2134 14     Temp 08/05/14 2134 98.2 F (36.8 C)     Temp Source 08/05/14 2134 Oral     SpO2 08/05/14 2134 94 %     Weight 08/05/14 2134 205 lb (92.987 kg)     Height 08/05/14 2134 5\' 10"  (1.778 m)     Head Cir --      Peak Flow --      Pain Score 08/05/14 2135 8     Pain Loc --      Pain Edu? --      Excl. in [place]? --      Constitutional: Alert and oriented. Well appearing and in no distress. Calm and comfortable, reeks of cigarette smoke Eyes: No scleral icterus. No  conjunctival pallor. PERRL. EOMI ENT   Head: Normocephalic and atraumatic.   Nose: No congestion/rhinnorhea. No septal hematoma   Mouth/Throat: MMM, mild pharyngeal erythema. No peritonsillar mass. No uvula shift. Several small soft tissue nodules in the mouth along the left buccal mucosa and left sublingual mucosa   Neck: No stridor. No SubQ emphysema. No meningismus. Hematological/Lymphatic/Immunilogical: No cervical lymphadenopathy. Cardiovascular: RRR. Normal and symmetric distal pulses are present in all extremities. No murmurs, rubs, or gallops. Respiratory: Normal respiratory effort without tachypnea nor retractions. Breath sounds are clear and equal bilaterally. No wheezes/rales/rhonchi. Gastrointestinal: Soft and nontender. No distention. There is no CVA tenderness.  No rebound, rigidity, or guarding. Genitourinary: deferred Musculoskeletal: Nontender with normal range of motion in all extremities. No joint effusions.  No lower extremity tenderness.  No edema. Neurologic:   Normal speech and language.  CN 2-10 normal. Motor grossly intact. No pronator drift.  Normal gait. No gross focal neurologic deficits are appreciated.  Skin:  Skin is warm, dry and intact. No rash noted.  No petechiae, purpura, or bullae. Psychiatric: Mood and affect are normal. Speech and behavior are normal. Patient exhibits appropriate insight and judgment.  ____________________________________________    LABS (pertinent positives/negatives) (all labs ordered are listed, but only abnormal results are displayed) Labs Reviewed - No data to display ____________________________________________   EKG    ____________________________________________    RADIOLOGY    ____________________________________________   PROCEDURES  ____________________________________________   INITIAL IMPRESSION / ASSESSMENT AND PLAN / ED COURSE  Pertinent labs & imaging results that were available  during my care of the patient were reviewed by me and considered in my medical decision making (see chart for details).  No airway compromise. No evidence of soft tissue infection or abscess. Stable nontoxic in no distress. Decadron and said viscous lidocaine. Follow-up with ENT.  ____________________________________________   FINAL CLINICAL IMPRESSION(S) / ED DIAGNOSES  Final diagnoses:  Dysphagia      Carrie Mew, MD 08/05/14 2212

## 2015-05-22 ENCOUNTER — Emergency Department (HOSPITAL_COMMUNITY)
Admission: EM | Admit: 2015-05-22 | Discharge: 2015-05-22 | Disposition: A | Discharge status: Home / Self Care | Payer: Medicaid Other | Attending: Emergency Medicine | Admitting: Emergency Medicine

## 2015-05-22 ENCOUNTER — Encounter (HOSPITAL_COMMUNITY): Payer: Self-pay

## 2015-05-22 ENCOUNTER — Emergency Department (HOSPITAL_COMMUNITY): Payer: Medicaid Other

## 2015-05-22 DIAGNOSIS — N5082 Scrotal pain: Secondary | ICD-10-CM

## 2015-05-22 DIAGNOSIS — R1032 Left lower quadrant pain: Secondary | ICD-10-CM | POA: Insufficient documentation

## 2015-05-22 DIAGNOSIS — Z87891 Personal history of nicotine dependence: Secondary | ICD-10-CM | POA: Diagnosis not present

## 2015-05-22 DIAGNOSIS — N50811 Right testicular pain: Secondary | ICD-10-CM | POA: Insufficient documentation

## 2015-05-22 DIAGNOSIS — N50819 Testicular pain, unspecified: Secondary | ICD-10-CM

## 2015-05-22 DIAGNOSIS — F319 Bipolar disorder, unspecified: Secondary | ICD-10-CM | POA: Insufficient documentation

## 2015-05-22 DIAGNOSIS — N5089 Other specified disorders of the male genital organs: Secondary | ICD-10-CM

## 2015-05-22 DIAGNOSIS — I10 Essential (primary) hypertension: Secondary | ICD-10-CM | POA: Diagnosis not present

## 2015-05-22 DIAGNOSIS — Z79899 Other long term (current) drug therapy: Secondary | ICD-10-CM | POA: Insufficient documentation

## 2015-05-22 DIAGNOSIS — N433 Hydrocele, unspecified: Secondary | ICD-10-CM | POA: Insufficient documentation

## 2015-05-22 DIAGNOSIS — Z79891 Long term (current) use of opiate analgesic: Secondary | ICD-10-CM | POA: Insufficient documentation

## 2015-05-22 LAB — CBC WITH DIFFERENTIAL/PLATELET
BASOS PCT: 0 %
Basophils Absolute: 0 10*3/uL (ref 0.0–0.1)
EOS ABS: 0.4 10*3/uL (ref 0.0–0.7)
Eosinophils Relative: 4 %
HCT: 42.5 % (ref 39.0–52.0)
Hemoglobin: 14.5 g/dL (ref 13.0–17.0)
Lymphocytes Relative: 40 %
Lymphs Abs: 3.8 10*3/uL (ref 0.7–4.0)
MCH: 31.3 pg (ref 26.0–34.0)
MCHC: 34.1 g/dL (ref 30.0–36.0)
MCV: 91.8 fL (ref 78.0–100.0)
MONO ABS: 1 10*3/uL (ref 0.1–1.0)
MONOS PCT: 11 %
NEUTROS PCT: 45 %
Neutro Abs: 4.2 10*3/uL (ref 1.7–7.7)
Platelets: 234 10*3/uL (ref 150–400)
RBC: 4.63 MIL/uL (ref 4.22–5.81)
RDW: 13.6 % (ref 11.5–15.5)
WBC: 9.4 10*3/uL (ref 4.0–10.5)

## 2015-05-22 LAB — BASIC METABOLIC PANEL
Anion gap: 5 (ref 5–15)
BUN: 9 mg/dL (ref 6–20)
CALCIUM: 9.1 mg/dL (ref 8.9–10.3)
CO2: 27 mmol/L (ref 22–32)
Chloride: 105 mmol/L (ref 101–111)
Creatinine, Ser: 1.17 mg/dL (ref 0.61–1.24)
GLUCOSE: 91 mg/dL (ref 65–99)
Potassium: 4.4 mmol/L (ref 3.5–5.1)
Sodium: 137 mmol/L (ref 135–145)

## 2015-05-22 LAB — URINALYSIS, ROUTINE W REFLEX MICROSCOPIC
Bilirubin Urine: NEGATIVE
GLUCOSE, UA: NEGATIVE mg/dL
HGB URINE DIPSTICK: NEGATIVE
Ketones, ur: NEGATIVE mg/dL
Leukocytes, UA: NEGATIVE
Nitrite: NEGATIVE
PH: 6 (ref 5.0–8.0)
Protein, ur: NEGATIVE mg/dL
SPECIFIC GRAVITY, URINE: 1.017 (ref 1.005–1.030)

## 2015-05-22 MED ORDER — NAPROXEN 500 MG PO TABS: 500.0000 mg | ORAL_TABLET | Freq: Two times a day (BID) | ORAL | Status: DC

## 2015-05-22 MED ORDER — OXYCODONE-ACETAMINOPHEN 5-325 MG PO TABS: 1.0000 | ORAL_TABLET | ORAL | Status: DC | PRN

## 2015-05-22 MED ORDER — OXYCODONE-ACETAMINOPHEN 5-325 MG PO TABS
2.0000 | ORAL_TABLET | Freq: Once | ORAL | Status: AC
  Administered 2015-05-22: 2 via ORAL
  Filled 2015-05-22: qty 2

## 2015-05-22 NOTE — ED Notes (Signed)
Patient was alert, oriented and stable upon discharge. RN went over AVS and patient had no further questions.  

## 2015-05-22 NOTE — ED Notes (Signed)
Patient transported to Ultrasound 

## 2015-05-22 NOTE — ED Provider Notes (Signed)
CSN: [id number]     Arrival date & time 05/22/15  1500 History   First MD Initiated Contact with Patient 05/22/15 1528     Chief Complaint  Patient presents with  . Groin Pain     (Consider location/radiation/quality/duration/timing/severity/associated sxs/prior Treatment) HPI   Kathleen Prescott is a 45 y.o. male, with a history of Hypertension, bipolar, and schizophrenia, presenting to the ED with testicular and groin pain that began May 17. Pt had bilateral inguinal hernia repair 2-3 weeks ago at [facility] by Dr. Narda Bonds. Pt is supposed to see him again in a week for follow up. Pain is only present with movement and palpation. Also endorses scrotal swelling. Pt states, "it feels like someone is squeezing my testicles." Pt was told to expect testicular pain for two weeks postop, but patient states this pain is different and is worsening. Pt rates his pain at 8/10, describes it as a burning, radiating to the left inner leg and groin. Also endorses some urinary frequency. Pt denies fever/chills, N/V/C/D, abdominal pain, penile discharge, dysuria/hematuria, or any other complaints.     Past Medical History  Diagnosis Date  . Hypertension   . Bipolar disorder ([place])   . Schizophrenia ([place])   . Anxiety   . PTSD (post-traumatic stress disorder)    Past Surgical History  Procedure Laterality Date  . Tympanoplasty    . Arm debridement    . Hernia repair     History reviewed. No pertinent family history. Social History  Substance Use Topics  . Smoking status: Former Smoker -- 1.00 packs/day    Types: Cigarettes  . Smokeless tobacco: Never Used  . Alcohol Use: No    Review of Systems  Constitutional: Negative for fever and chills.  Gastrointestinal: Negative for nausea, vomiting, abdominal pain, diarrhea, constipation and blood in stool.  Genitourinary: Positive for frequency, scrotal swelling and testicular pain. Negative for dysuria, hematuria, flank pain, discharge and difficulty urinating.   All other systems reviewed and are negative.     Allergies  Morphine  Home Medications   Prior to Admission medications   Medication Sig Start Date End Date Taking? Authorizing Provider  amLODipine (NORVASC) 5 MG tablet Take 5 mg by mouth daily. 04/27/15  Yes Historical Provider, MD  beclomethasone (QVAR) 40 MCG/ACT inhaler Inhale 2 puffs into the lungs 2 (two) times daily.   Yes Historical Provider, MD  doxepin (SINEQUAN) 50 MG capsule Take 50 mg by mouth at bedtime. 04/19/15  Yes Historical Provider, MD  escitalopram (LEXAPRO) 10 MG tablet Take 10 mg by mouth daily. 05/02/15  Yes Historical Provider, MD  levalbuterol Penne Lash HFA) 45 MCG/ACT inhaler Inhale 2 puffs into the lungs every 4 (four) hours. 06/14/14 06/14/15 Yes Historical Provider, MD  methocarbamol (ROBAXIN) 750 MG tablet Take 750 mg by mouth 2 (two) times daily as needed for muscle spasms.  02/23/15 05/24/15 Yes Historical Provider, MD  omeprazole (PRILOSEC) 20 MG capsule Take 20 mg by mouth 2 (two) times daily before a meal.  03/02/15  Yes Historical Provider, MD  oxyCODONE (OXY IR/ROXICODONE) 5 MG immediate release tablet Take 5 mg by mouth every 4 (four) hours as needed for moderate pain or severe pain.  05/02/15  Yes Historical Provider, MD  polyethylene glycol (MIRALAX / GLYCOLAX) packet Take 17 g by mouth daily as needed for mild constipation or moderate constipation.   Yes Historical Provider, MD  ziprasidone (GEODON) 80 MG capsule Take 80 mg by mouth 2 (two) times daily with a meal.  05/08/15  Yes Historical Provider, MD  naproxen (NAPROSYN) 500 MG tablet Take 1 tablet (500 mg total) by mouth 2 (two) times daily. 05/22/15   Chenita Ruda C Barrett Goldie, PA-C  oxyCODONE-acetaminophen (PERCOCET/ROXICET) 5-325 MG tablet Take 1-2 tablets by mouth every 4 (four) hours as needed for severe pain. 05/22/15   Carmel Waddington C Alynah Schone, PA-C   BP 106/81 mmHg  Pulse 59  Temp(Src) 98.3 F (36.8 C) (Oral)  Resp 18  SpO2 98% Physical Exam  Constitutional: He appears  well-developed and well-nourished. No distress.  HENT:  Head: Normocephalic and atraumatic.  Eyes: Conjunctivae are normal. Pupils are equal, round, and reactive to light.  Neck: Neck supple.  Cardiovascular: Normal rate, regular rhythm, normal heart sounds and intact distal pulses.   Pulmonary/Chest: Effort normal and breath sounds normal. No respiratory distress.  Abdominal: Soft. There is no tenderness. There is no guarding.  Genitourinary: Penis normal. Cremasteric reflex is present. Right testis shows swelling and tenderness. Left testis shows swelling and tenderness. Circumcised.  Swelling and tenderness to scrotum bilaterally. No penile discharge or penile pain/swelling. Tenderness to the inner left thigh.   Musculoskeletal: He exhibits no edema or tenderness.  Lymphadenopathy:    He has no cervical adenopathy.       Right: No inguinal adenopathy present.       Left: No inguinal adenopathy present.  Neurological: He is alert.  Skin: Skin is warm and dry. He is not diaphoretic.  Psychiatric: He has a normal mood and affect. His behavior is normal.  Nursing note and vitals reviewed.   ED Course  Procedures (including critical care time) Labs Review Labs Reviewed  URINE CULTURE  BASIC METABOLIC PANEL  CBC WITH DIFFERENTIAL/PLATELET  URINALYSIS, ROUTINE W REFLEX MICROSCOPIC (NOT AT [facility])    Imaging Review US Scrotum  05/22/2015  CLINICAL DATA:  Right testicular pain and swelling for 2 weeks since hernia repair. EXAM: SCROTAL ULTRASOUND DOPPLER ULTRASOUND OF THE TESTICLES TECHNIQUE: Complete ultrasound examination of the testicles, epididymis, and other scrotal structures was performed. Color and spectral Doppler ultrasound were also utilized to evaluate blood flow to the testicles. COMPARISON:  None. FINDINGS: Right testicle Measurements: 4.6 x 2.8 x 3.4 cm. There is a striated appearance to the right testicle, asymmetric to the left. No focal masses. Left testicle Measurements:  4.3 x 2.2 x 2.8 cm. No mass or microlithiasis visualized. Right epididymis:  Normal in size and appearance. Left epididymis:  Normal in size and appearance. Hydrocele: Moderate to large hydrocele on the right and small hydrocele on the left. Varicocele:  None visualized. Pulsed Doppler interrogation of both testes demonstrates normal low resistance arterial and venous waveforms bilaterally. IMPRESSION: 1. Moderate to large hydrocele on the right with a small hydrocele on the left. 2. There is a striated appearance to the right testicle versus the left. The striated testicular appearance with ultrasound is nonspecific and can be normal. However, it can also be seen in the setting of a variety of etiologies including but not limited to infection or inflammation. There is no hyperemia in the right testicle to suggest orchitis. Recommend clinical correlation. 3. Arterial and venous waveforms are seen in both testicles, similar bilaterally. The side by side Doppler image 2 demonstrate symmetric blood flow in the bilateral testicles. No evidence of torsion. Electronically Signed   By: Dorise Bullion III M.D   On: 05/22/2015 16:58   [place] Art/ven Flow Abd Pelv Doppler  05/22/2015  CLINICAL DATA:  Right testicular pain and swelling for 2 weeks since hernia  repair. EXAM: SCROTAL ULTRASOUND DOPPLER ULTRASOUND OF THE TESTICLES TECHNIQUE: Complete ultrasound examination of the testicles, epididymis, and other scrotal structures was performed. Color and spectral Doppler ultrasound were also utilized to evaluate blood flow to the testicles. COMPARISON:  None. FINDINGS: Right testicle Measurements: 4.6 x 2.8 x 3.4 cm. There is a striated appearance to the right testicle, asymmetric to the left. No focal masses. Left testicle Measurements: 4.3 x 2.2 x 2.8 cm. No mass or microlithiasis visualized. Right epididymis:  Normal in size and appearance. Left epididymis:  Normal in size and appearance. Hydrocele: Moderate to large  hydrocele on the right and small hydrocele on the left. Varicocele:  None visualized. Pulsed Doppler interrogation of both testes demonstrates normal low resistance arterial and venous waveforms bilaterally. IMPRESSION: 1. Moderate to large hydrocele on the right with a small hydrocele on the left. 2. There is a striated appearance to the right testicle versus the left. The striated testicular appearance with ultrasound is nonspecific and can be normal. However, it can also be seen in the setting of a variety of etiologies including but not limited to infection or inflammation. There is no hyperemia in the right testicle to suggest orchitis. Recommend clinical correlation. 3. Arterial and venous waveforms are seen in both testicles, similar bilaterally. The side by side Doppler image 2 demonstrate symmetric blood flow in the bilateral testicles. No evidence of torsion. Electronically Signed   By: Dorise Bullion III M.D   On: 05/22/2015 16:58   I have personally reviewed and evaluated these images and lab results as part of my medical decision-making.   EKG Interpretation None      MDM   Final diagnoses:  Groin pain, left  Scrotal pain  Scrotal swelling  Hydrocele in adult    Danae Chen presents with bilateral groin and testicular pain increasing over the last 4 days.  Findings and plan of care discussed with Forde Dandy, MD. Dr. Oleta Mouse personally evaluated and examined this patient.  Patient's presentation seems to be different than expected postop pain and swelling. Patient's inner thigh pain seems to be a neuralgic pain. No abnormalities on patient's labs. Upon reassessment, patient states his pain has improved. Patient will need to follow-up with his surgeon, Dr. Narda Bonds with [facility]. Patient was instructed to call Dr. Gennaro Africa office tomorrow morning to set up an appointment for reevaluation. Patient to also follow up with urology due to the hydroceles. Return precautions discussed. Patient  voiced understanding of these instructions, accepts the plan, and is comfortable with discharge.   Filed Vitals:   05/22/15 1505 05/22/15 1709  BP: 117/87 106/81  Pulse: 69 59  Temp: 98.3 F (36.8 C)   Resp: [address] PA-C 05/22/15 2134  Forde Dandy, MD 05/23/15 [phone]

## 2015-05-22 NOTE — Discharge Instructions (Signed)
You have been seen today for groin pain. Your lab tests showed no abnormalities, but your ultrasound showed signs of swelling and fluid collection in the scrotum, called a hydrocele.  1. Follow up with the surgeon that performed your surgery as soon as possible. Call the office tomorrow morning to set up an appointment. Naproxen or ibuprofen for pain and inflammation. Percocet for severe pain.  2. Follow-up with urology as soon as possible to evaluate the hydroceles. Call the phone number provided to set up an appointment. 3. Return to ED should symptoms worsen.

## 2015-05-22 NOTE — ED Notes (Addendum)
Pt here with groin pain.  Pt had hernia surgery x 2-3 weeks ago.  Pt has had testicular pain.  Pain is not getting better.  Pt with pain also between testicle and groin.  Pt states pain x 2-3 days.  MD told him that pain would be in testicle but part of leg in pain is unbearable.

## 2015-05-23 LAB — URINE CULTURE: CULTURE: NO GROWTH

## 2015-05-26 ENCOUNTER — Ambulatory Visit (INDEPENDENT_AMBULATORY_CARE_PROVIDER_SITE_OTHER): Payer: Medicaid Other | Admitting: Urology

## 2015-05-26 ENCOUNTER — Encounter: Payer: Self-pay | Admitting: Urology

## 2015-05-26 VITALS — BP 127/88 | HR 70 | Temp 97.6°F | Ht 70.0 in | Wt 211.6 lb

## 2015-05-26 DIAGNOSIS — F329 Major depressive disorder, single episode, unspecified: Secondary | ICD-10-CM | POA: Insufficient documentation

## 2015-05-26 DIAGNOSIS — N433 Hydrocele, unspecified: Secondary | ICD-10-CM | POA: Diagnosis not present

## 2015-05-26 DIAGNOSIS — F32A Depression, unspecified: Secondary | ICD-10-CM | POA: Insufficient documentation

## 2015-05-26 HISTORY — DX: Depression, unspecified: F32.A

## 2015-05-26 NOTE — Progress Notes (Signed)
05/26/2015 9:00 AM   Derrick Maldonado 06-13-1970 [id number]  Referring provider: Gennaro Africa, MD [address] [place], [place] [address]  Chief Complaint  Patient presents with  . New Patient (Initial Visit)    cyst R testicle     HPI: The patient is a 45 year old male who presents for evaluation of scrotal swelling. The patient recently had bilateral inguinal hernia surgery. After that, he noticed that his right testicle became more swollen and tender. He was recently seen in the ER and infection workup was negative. He states that he takes Tylenol 3 for his pain with this does not relieve it. He is concerned as the swelling is new since his inguinal hernia surgery. He denies any other history of urinary issues.    Recent urine culture was negative.  Scrotal ultrasound was significant for a right hydrocele and nonspecific striations of the testicle. There are no discrete masses to suggest malignancy.  PMH: Past Medical History  Diagnosis Date  . Hypertension   . Bipolar disorder ([place])   . Schizophrenia ([place])   . Anxiety   . PTSD (post-traumatic stress disorder)   . Chronic obstructive pulmonary disease ([place]) 12/19/2012    Last Assessment & Plan:  Condition is worse Differential includes URI Medication and treatment plan as described in orders or med list. Patient education handout provided.   . Acid reflux 05/12/2012    Last Assessment & Plan:  Condition is unchanged Medication and treatment plan as described in orders. Patient education handout provided.   . 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 Continue with post-operative care; reviewed infection precautions   . Cervical spondylosis without myelopathy 05/07/2013  . Scar condition and fibrosis of skin 02/13/2005  . Benzodiazepine dependence ([place]) 10/16/2013  . Astigmatism,  regular 12/12/2010  . Cannabis abuse 10/16/2013  . Cervical pain 05/07/2013  . Chronic pain associated with significant psychosocial dysfunction 07/21/2014  . Cocaine abuse 06/03/2013  . Clinical depression 05/26/2015  . Intentional drug overdose ([place]) 02/16/2014  . Error, refractive, myopia 12/12/2010  . Episodic mood disorder ([place]) 10/10/2013    Surgical History: Past Surgical History  Procedure Laterality Date  . Tympanoplasty    . Arm debridement    . Hernia repair      Home Medications:    Medication List       This list is accurate as of: 05/26/15  9:00 AM.  Always use your most recent med list.               amLODipine 5 MG tablet  Commonly known as:  NORVASC  Take 5 mg by mouth daily.     doxepin 50 MG capsule  Commonly known as:  SINEQUAN  Take 50 mg by mouth at bedtime.     escitalopram 10 MG tablet  Commonly known as:  LEXAPRO  Take 10 mg by mouth daily.     levalbuterol 45 MCG/ACT inhaler  Commonly known as:  XOPENEX HFA  Inhale 2 puffs into the lungs every 4 (four) hours.     naproxen 500 MG tablet  Commonly known as:  NAPROSYN  Take 1 tablet (500 mg total) by mouth 2 (two) times daily.     omeprazole 20 MG capsule  Commonly known as:  PRILOSEC  Take 20 mg by mouth 2 (two) times daily before  a meal.     oxyCODONE 5 MG immediate release tablet  Commonly known as:  Oxy IR/ROXICODONE  Take 5 mg by mouth every 4 (four) hours as needed for moderate pain or severe pain. Reported on 05/26/2015     oxyCODONE-acetaminophen 5-325 MG tablet  Commonly known as:  PERCOCET/ROXICET  Take 1-2 tablets by mouth every 4 (four) hours as needed for severe pain.     polyethylene glycol packet  Commonly known as:  MIRALAX / GLYCOLAX  Take 17 g by mouth daily as needed for mild constipation or moderate constipation.     QVAR 40 MCG/ACT inhaler  Generic drug:  beclomethasone  Inhale 2 puffs into the lungs 2 (two) times daily.     ziprasidone 80 MG capsule  Commonly  known as:  GEODON  Take 80 mg by mouth 2 (two) times daily with a meal.        Allergies:  Allergies  Allergen Reactions  . Morphine Hives and Swelling    Family History: No family history on file.  Social History:  reports that he has been smoking Cigarettes.  He has been smoking about 0.50 packs per day. He has never used smokeless tobacco. He reports that he does not drink alcohol or use illicit drugs.  ROS: UROLOGY Frequent Urination?: Yes Hard to postpone urination?: No Burning/pain with urination?: No Get up at night to urinate?: Yes Leakage of urine?: No Urine stream starts and stops?: No Trouble starting stream?: No Do you have to strain to urinate?: Yes Blood in urine?: No Urinary tract infection?: No Sexually transmitted disease?: No Injury to kidneys or bladder?: No Painful intercourse?: No Weak stream?: No Erection problems?: Yes Penile pain?: No  Gastrointestinal Nausea?: No Vomiting?: No Indigestion/heartburn?: No Diarrhea?: No Constipation?: Yes  Constitutional Fever: Yes Night sweats?: No Weight loss?: No Fatigue?: No  Skin Skin rash/lesions?: No Itching?: No  Eyes Blurred vision?: No Double vision?: No  Ears/Nose/Throat Sore throat?: No Sinus problems?: No  Hematologic/Lymphatic Swollen glands?: No Easy bruising?: No  Cardiovascular Leg swelling?: No Chest pain?: No  Respiratory Cough?: Yes Shortness of breath?: No  Endocrine Excessive thirst?: No  Musculoskeletal Back pain?: No Joint pain?: No  Neurological Headaches?: No Dizziness?: No  Psychologic Depression?: No Anxiety?: Yes  Physical Exam: BP 127/88 mmHg  Pulse 70  Temp(Src) 97.6 F (36.4 C) (Oral)  Ht 5\' 10"  (1.778 m)  Wt 211 lb 9.6 oz (95.981 kg)  BMI 30.36 kg/m2  Constitutional:  Alert and oriented, No acute distress. HEENT: Post AT, moist mucus membranes.  Trachea midline, no masses. Cardiovascular: No clubbing, cyanosis, or edema. Respiratory:  Normal respiratory effort, no increased work of breathing. GI: Abdomen is soft, nontender, nondistended, no abdominal masses GU: No CVA tenderness. Small right hydrocele palpable on exam. Testicles bilaterally nontender to palpation. No appreciable masses palpable. Normal phallus. Skin: No rashes, bruises or suspicious lesions. Lymph: No cervical or inguinal adenopathy. Neurologic: Grossly intact, no focal deficits, moving all 4 extremities. Psychiatric: Normal mood and affect.  Laboratory Data: Lab Results  Component Value Date   WBC 9.4 05/22/2015   HGB 14.5 05/22/2015   HCT 42.5 05/22/2015   MCV 91.8 05/22/2015   PLT 234 05/22/2015    Lab Results  Component Value Date   CREATININE 1.17 05/22/2015    No results found for: PSA  No results found for: TESTOSTERONE  No results found for: HGBA1C  Urinalysis    Component Value Date/Time   COLORURINE YELLOW 05/22/2015 1555  APPEARANCEUR CLEAR 05/22/2015 1555   LABSPEC 1.017 05/22/2015 1555   PHURINE 6.0 05/22/2015 1555   GLUCOSEU NEGATIVE 05/22/2015 1555   HGBUR NEGATIVE 05/22/2015 [place] 05/22/2015 [place] 05/22/2015 [place] 05/22/2015 1555   NITRITE NEGATIVE 05/22/2015 1555   LEUKOCYTESUR NEGATIVE 05/22/2015 1555    Pertinent Imaging: CLINICAL DATA: Right testicular pain and swelling for 2 weeks since hernia repair.  EXAM: SCROTAL ULTRASOUND  DOPPLER ULTRASOUND OF THE TESTICLES  TECHNIQUE: Complete ultrasound examination of the testicles, epididymis, and other scrotal structures was performed. Color and spectral Doppler ultrasound were also utilized to evaluate blood flow to the testicles.  COMPARISON: None.  FINDINGS: Right testicle  Measurements: 4.6 x 2.8 x 3.4 cm. There is a striated appearance to the right testicle, asymmetric to the left. No focal masses.  Left testicle  Measurements: 4.3 x 2.2 x 2.8 cm. No mass or  microlithiasis visualized.  Right epididymis: Normal in size and appearance.  Left epididymis: Normal in size and appearance.  Hydrocele: Moderate to large hydrocele on the right and small hydrocele on the left.  Varicocele: None visualized.  Pulsed Doppler interrogation of both testes demonstrates normal low resistance arterial and venous waveforms bilaterally.  IMPRESSION: 1. Moderate to large hydrocele on the right with a small hydrocele on the left. 2. There is a striated appearance to the right testicle versus the left. The striated testicular appearance with ultrasound is nonspecific and can be normal. However, it can also be seen in the setting of a variety of etiologies including but not limited to infection or inflammation. There is no hyperemia in the right testicle to suggest orchitis. Recommend clinical correlation. 3. Arterial and venous waveforms are seen in both testicles, similar bilaterally. The side by side Doppler image 2 demonstrate symmetric blood flow in the bilateral testicles. No evidence of torsion.   Assessment & Plan:    1. Right hydrocele The patient has a small right hydrocele that may or may not be related to his recent inguninal hernia surgery. At this time for his scrotal discomfort, I recommended ibuprofen 6 mg 3 times a day around-the-clock for a month. He'll contact the office if he still symptomatic in one month. He will otherwise follow-up when necessary.  Return in about 4 weeks (around 06/23/2015).  Nickie Retort, MD  [facility] [address], [place], [place] [address] [phone]

## 2015-07-05 ENCOUNTER — Emergency Department: Payer: Medicaid Other

## 2015-07-05 ENCOUNTER — Emergency Department
Admission: EM | Admit: 2015-07-05 | Discharge: 2015-07-05 | Disposition: A | Discharge status: Home / Self Care | Payer: Medicaid Other | Attending: Emergency Medicine | Admitting: Emergency Medicine

## 2015-07-05 DIAGNOSIS — F319 Bipolar disorder, unspecified: Secondary | ICD-10-CM | POA: Diagnosis not present

## 2015-07-05 DIAGNOSIS — I10 Essential (primary) hypertension: Secondary | ICD-10-CM | POA: Insufficient documentation

## 2015-07-05 DIAGNOSIS — N50812 Left testicular pain: Secondary | ICD-10-CM

## 2015-07-05 DIAGNOSIS — Z79899 Other long term (current) drug therapy: Secondary | ICD-10-CM | POA: Insufficient documentation

## 2015-07-05 DIAGNOSIS — N433 Hydrocele, unspecified: Secondary | ICD-10-CM | POA: Insufficient documentation

## 2015-07-05 DIAGNOSIS — F17210 Nicotine dependence, cigarettes, uncomplicated: Secondary | ICD-10-CM | POA: Insufficient documentation

## 2015-07-05 DIAGNOSIS — F209 Schizophrenia, unspecified: Secondary | ICD-10-CM | POA: Insufficient documentation

## 2015-07-05 DIAGNOSIS — J449 Chronic obstructive pulmonary disease, unspecified: Secondary | ICD-10-CM | POA: Insufficient documentation

## 2015-07-05 MED ORDER — HYDROCODONE-ACETAMINOPHEN 5-325 MG PO TABS
1.0000 | ORAL_TABLET | Freq: Four times a day (QID) | ORAL | Status: DC | PRN
Start: 2015-07-05 — End: 2016-03-27

## 2015-07-05 NOTE — ED Notes (Signed)
Pt present to the ED today with reports of right sided testicle pain and edema  Pt reports a hernia a few months ago and the doctors said the pain would go away but it has not  10/10 pain reported

## 2015-07-05 NOTE — ED Notes (Signed)
States he had hernia surgery about 3 months ago  Recently dx'd with a cyst on his testicles. Was seen at [facility] and [facility] . Waiting f/u with urologist for same.states he is here for pain control

## 2015-07-05 NOTE — ED Provider Notes (Signed)
[facility] Emergency Department Provider Note  ____________________________________________  Time seen: Approximately 2:33 PM  I have reviewed the triage vital signs and the nursing notes.   HISTORY  Chief Complaint Testicle Pain    HPI Derrick Maldonado is a 45 y.o. male, NAD, presents to the emergency department with 3 day history of right testicular swelling and pain. Patient states the pain and swelling began four months ago after a bilateral inguinal hernia repair and have been constant since that time. Has been seen by a urologist who told him he had a "cyst" and due to recent increase in pain made an appointment for drainage on July 14. He has been tested for infection in the past and all tests were negative over the last few weeks. This morning area was so tender that water from the shower hitting the area caused him to become nauseous and vomit x1. Describes pain as squeezing and rated 10/10. Attempted management with Ibuprofen, aleve, and Tylenol #3 without significant relief of pain. No alleviating factors but states that holding his urine for too long exacerbates the pain. Denies fevers, night sweats, abdominal pain, dysuria, hematuria, urethral discharge, nor interrupted urine stream. Has not noted any redness, skin sores to the scrotum.   Past Medical History  Diagnosis Date  . Hypertension   . Bipolar disorder ([place])   . Schizophrenia ([place])   . Anxiety   . PTSD (post-traumatic stress disorder)   . Chronic obstructive pulmonary disease ([place]) 12/19/2012    Last Assessment & Plan:  Condition is worse Differential includes URI Medication and treatment plan as described in orders or med list. Patient education handout provided.   . Acid reflux 05/12/2012    Last Assessment & Plan:  Condition is unchanged Medication and treatment plan as described in orders. Patient education handout provided.   . 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 Benign neoplasm of  oral cavity 04/09/2012    Last Assessment & Plan:  Had laser ablation surgery last week and has follow-up with ENT this week. Continue with post-operative care; reviewed infection precautions   . Cervical spondylosis without myelopathy 05/07/2013  . Scar condition and fibrosis of skin 02/13/2005  . Benzodiazepine dependence ([place]) 10/16/2013  . Astigmatism, regular 12/12/2010  . Cannabis abuse 10/16/2013  . Cervical pain 05/07/2013  . Chronic pain associated with significant psychosocial dysfunction 07/21/2014  . Cocaine abuse 06/03/2013  . Clinical depression 05/26/2015  . Intentional drug overdose ([place]) 02/16/2014  . Error, refractive, myopia 12/12/2010  . Episodic mood disorder ([place]) 10/10/2013    Patient Active Problem List   Diagnosis Date Noted  . Clinical depression 05/26/2015  . Chronic pain associated with significant psychosocial dysfunction 07/21/2014  . Intentional drug overdose ([place]) 02/16/2014  . Benzodiazepine dependence ([place]) 10/16/2013  . Cannabis abuse 10/16/2013  . Drug-induced mood disorder ([place]) 10/16/2013  . Episodic mood disorder ([place]) 10/10/2013  . Cocaine abuse 06/03/2013  . Cervical spondylosis without myelopathy 05/07/2013  . Cervical pain 05/07/2013  . Chronic obstructive pulmonary disease ([place]) 12/19/2012  . Current tobacco use 12/19/2012  . Acid reflux 05/12/2012  . 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 Error, refractive, myopia 12/12/2010  . Astigmatism, regular 12/12/2010  . Scar condition and fibrosis of skin 02/13/2005    Past Surgical History  Procedure Laterality Date  . Tympanoplasty    . Arm debridement    . Hernia repair      Current Outpatient Rx  Name  Route  Sig  Dispense  Refill  . amLODipine (NORVASC) 5 MG tablet   Oral   Take 5 mg by mouth daily.      3   . beclomethasone (QVAR) 40 MCG/ACT inhaler   Inhalation   Inhale 2 puffs into the lungs 2 (two) times daily.         Marland Kitchen doxepin  (SINEQUAN) 50 MG capsule   Oral   Take 50 mg by mouth at bedtime.      2   . escitalopram (LEXAPRO) 10 MG tablet   Oral   Take 10 mg by mouth daily.      2   . HYDROcodone-acetaminophen (NORCO) 5-325 MG tablet   Oral   Take 1-2 tablets by mouth every 6 (six) hours as needed for severe pain.   16 tablet   0   . EXPIRED: levalbuterol (XOPENEX HFA) 45 MCG/ACT inhaler   Inhalation   Inhale 2 puffs into the lungs every 4 (four) hours.         . naproxen (NAPROSYN) 500 MG tablet   Oral   Take 1 tablet (500 mg total) by mouth 2 (two) times daily. Patient not taking: Reported on 05/26/2015   30 tablet   0   . omeprazole (PRILOSEC) 20 MG capsule   Oral   Take 20 mg by mouth 2 (two) times daily before a meal.          . oxyCODONE (OXY IR/ROXICODONE) 5 MG immediate release tablet   Oral   Take 5 mg by mouth every 4 (four) hours as needed for moderate pain or severe pain. Reported on 05/26/2015      0   . oxyCODONE-acetaminophen (PERCOCET/ROXICET) 5-325 MG tablet   Oral   Take 1-2 tablets by mouth every 4 (four) hours as needed for severe pain. Patient not taking: Reported on 05/26/2015   6 tablet   0   . polyethylene glycol (MIRALAX / GLYCOLAX) packet   Oral   Take 17 g by mouth daily as needed for mild constipation or moderate constipation.         . ziprasidone (GEODON) 80 MG capsule   Oral   Take 80 mg by mouth 2 (two) times daily with a meal.       2     Allergies Morphine  No family history on file.  Social History Social History  Substance Use Topics  . Smoking status: Current Every Day Smoker -- 0.50 packs/day    Types: Cigarettes  . Smokeless tobacco: Never Used  . Alcohol Use: No     Review of Systems  Constitutional: No fever, night sweats, fatigue Eyes: No visual changes, blurred vision, diplopia. Cardiovascular: No chest pain or palpitations Respiratory: No cough, shortness of breath Gastrointestinal: Positive for nausea and vomiting  1. No abdominal pain or diarrhea. Genitourinary: Positive right testicular pain. Negative for dysuria, hematuria, urethral discharge, increased urinary frequency, urinary urgency or hesitancy. No changes in urinary stream. Musculoskeletal: Negative for back pain. Skin: Positive for swelling of the right testicle. Negative for rash, redness, skin sores Neurological: Negative for headaches, focal weakness or numbness. No tingling. No saddle paresthesias or loss of bowel or bladder control. 10-point ROS otherwise negative.  ____________________________________________   PHYSICAL EXAM:  VITAL SIGNS: ED Triage Vitals  Enc Vitals Group     BP 07/05/15 1326 129/84 mmHg     Pulse Rate 07/05/15 1326 79     Resp 07/05/15 1326 18  Temp 07/05/15 1326 98.8 F (37.1 C)     Temp Source 07/05/15 1326 Oral     SpO2 07/05/15 1326 97 %     Weight 07/05/15 1326 213 lb (96.616 kg)     Height 07/05/15 1326 5\' 10"  (1.778 m)     Head Cir --      Peak Flow --      Pain Score 07/05/15 1326 10     Pain Loc --      Pain Edu? --      Excl. in [place]? --     Physical exam completed int he presence of Ashlyn Djali, PA-S  Constitutional: Alert and oriented. Well appearing and in no acute distress. Eyes: Conjunctivae are normal.  Head: Atraumatic. Hematological/Lymphatic/Immunilogical: No Inguinal lymphadenopathy. Cardiovascular: Normal rate, regular rhythm. Normal S1 and S2.  Good peripheral circulation with 2+ inguinal pulses. Respiratory: Normal respiratory effort without tachypnea or retractions. Lungs CTAB with breath sounds noted in all lung fields. Gastrointestinal: Soft and nontender without distention or guarding in all quadrants. Genitourinary: Right testicle with moderate swelling as compared to the left. No tenderness to palpation of the actual testicles but tenderness palpated about the right posterior, superior portion of the scrotal sac. No "bag of worms" palpated. No pain to palpation of the  spermatic cord nor abnormalities palpated in this area. No urethral discharge. No open wounds or lesions about the groin. Postsurgical scars noted about bilateral inguinal areas that are well healed. Musculoskeletal: No lower extremity tenderness nor edema.  No joint effusions. Neurologic:  Normal speech and language. No gross focal neurologic deficits are appreciated.  Skin:  Skin is warm, dry and intact. No rash noted. Psychiatric: Mood and affect are normal. Speech and behavior are normal. Patient exhibits appropriate insight and judgement.   ____________________________________________   LABS  None ____________________________________________  EKG  None ____________________________________________  RADIOLOGY I have personally viewed and evaluated these images (plain radiographs) as part of my medical decision making, as well as reviewing the written report by the radiologist.  US Scrotum  07/05/2015  CLINICAL DATA:  Left testicular pain for 2 months EXAM: [place]: Complete ultrasound examination of the testicles, epididymis, and other scrotal structures was performed. Color and spectral Doppler ultrasound were also utilized to evaluate blood flow to the testicles. COMPARISON:  None. FINDINGS: Right testicle Measurements: 5.3 x 3.0 x 3.2 cm. No mass or microlithiasis visualized. Left testicle Measurements: 4.5 x 2.2 x 3.1 cm. No mass or microlithiasis visualized. Right epididymis:  Normal in size and appearance. Left epididymis:  Normal in size and appearance. Hydrocele:  Bilateral hydrocele is present right greater than left. Varicocele:  None visualized. Pulsed Doppler interrogation of both testes demonstrates normal low resistance arterial and venous waveforms bilaterally. IMPRESSION: No evidence of testicular torsion. Bilateral hydrocele right greater than left. Electronically Signed   By: Marybelle Killings M.D.   On: 07/05/2015 15:29    [place] Art/ven Flow Abd Pelv Doppler  07/05/2015  CLINICAL DATA:  Left testicular pain for 2 months EXAM: SCROTAL ULTRASOUND DOPPLER ULTRASOUND OF THE TESTICLES TECHNIQUE: Complete ultrasound examination of the testicles, epididymis, and other scrotal structures was performed. Color and spectral Doppler ultrasound were also utilized to evaluate blood flow to the testicles. COMPARISON:  None. FINDINGS: Right testicle Measurements: 5.3 x 3.0 x 3.2 cm. No mass or microlithiasis visualized. Left testicle Measurements: 4.5 x 2.2 x 3.1 cm. No mass or microlithiasis visualized. Right epididymis:  Normal in size  and appearance. Left epididymis:  Normal in size and appearance. Hydrocele:  Bilateral hydrocele is present right greater than left. Varicocele:  None visualized. Pulsed Doppler interrogation of both testes demonstrates normal low resistance arterial and venous waveforms bilaterally. IMPRESSION: No evidence of testicular torsion. Bilateral hydrocele right greater than left. Electronically Signed   By: Marybelle Killings M.D.   On: 07/05/2015 15:29    ____________________________________________    PROCEDURES  Procedure(s) performed: None    Medications - No data to display   ____________________________________________   INITIAL IMPRESSION / ASSESSMENT AND PLAN / ED COURSE  Pertinent imaging results that were available during my care of the patient were reviewed by me and considered in my medical decision making (see chart for details).  Patient's diagnosis is consistent with right hydrocele. Patient will be discharged home with prescriptions for Norco to take as needed for severe pain. Patient is to follow up with Dr. Jacqlyn Larsen at [facility]rology in [facility] in 1 day for recheck. Patient is given ED precautions to return to the ED for any worsening or new symptoms.    ____________________________________________  FINAL CLINICAL IMPRESSION(S) / ED DIAGNOSES  Final diagnoses:   Testicular pain, left  Right hydrocele      NEW MEDICATIONS STARTED DURING THIS VISIT:  Discharge Medication List as of 07/05/2015  4:05 PM    START taking these medications   Details  HYDROcodone-acetaminophen (NORCO) 5-325 MG tablet Take 1-2 tablets by mouth every 6 (six) hours as needed for severe pain., Starting 07/05/2015, Until Discontinued, Print            Braxton Feathers, PA-C 07/05/15 1632  Earleen Newport, MD 07/06/15 1240

## 2015-07-05 NOTE — Discharge Instructions (Signed)
Hydrocele, Adult  A hydrocele is a collection of fluid in the loose pouch of skin that holds the testicles (scrotum). Usually, it affects only one testicle.  CAUSES  This condition may be caused by:  · An injury to the scrotum.  · An infection.  · A tumor or cancer of the testicle.  · Twisting of a testicle.  · Decreased blood flow to the scrotum.  SYMPTOMS  A hydrocele feels like a water-filled balloon. It may also feel heavy. A hydrocele can cause:  · Swelling of the scrotum. The swelling may decrease when you lie down.  · Swelling of the groin.  · Mild discomfort in the scrotum.  · Pain. This can develop if the hydrocele was caused by infection or twisting.  DIAGNOSIS  This condition may be diagnosed with a medical history, physical exam, and imaging tests. You may also have blood and urine tests to check for infection.  TREATMENT  Treatment may include:  · Watching and waiting, particularly if the hydrocele causes no symptoms.  · Treatment of the underlying condition. This may include using antibiotic medicine.  · Surgery to drain the fluid. Some surgical options include:    Needle aspiration. For this procedure, a needle is used to drain fluid.    Hydrocelectomy. For this procedure, an incision is made in the scrotum to remove the fluid sac.  HOME CARE INSTRUCTIONS  · Keep all follow-up visits as told by your health care provider. This is important.  · Watch the hydrocele for any changes.  · Take over-the-counter and prescription medicines only as told by your health care provider.  · If you were prescribed an antibiotic medicine, use it as told by your health care provider. Do not stop using the antibiotic even if your condition improves.  SEEK MEDICAL CARE IF:  · The swelling in your scrotum or groin gets worse.  · The hydrocele becomes red, firm, tender to the touch, or painful.  · You notice any changes in the hydrocele.  · You have a fever.     This information is not intended to replace advice given to  you by your health care provider. Make sure you discuss any questions you have with your health care provider.     Document Released: 06/07/2009 Document Revised: 05/04/2014 Document Reviewed: 12/14/2013  Elsevier Interactive Patient Education ©2016 [employer]

## 2015-07-16 ENCOUNTER — Emergency Department
Admission: EM | Admit: 2015-07-16 | Discharge: 2015-07-16 | Disposition: A | Discharge status: Home / Self Care | Payer: Medicaid Other | Attending: Emergency Medicine | Admitting: Emergency Medicine

## 2015-07-16 ENCOUNTER — Encounter: Payer: Self-pay | Admitting: Emergency Medicine

## 2015-07-16 ENCOUNTER — Emergency Department: Payer: Medicaid Other

## 2015-07-16 DIAGNOSIS — F17210 Nicotine dependence, cigarettes, uncomplicated: Secondary | ICD-10-CM | POA: Diagnosis not present

## 2015-07-16 DIAGNOSIS — N50811 Right testicular pain: Secondary | ICD-10-CM | POA: Diagnosis present

## 2015-07-16 DIAGNOSIS — F319 Bipolar disorder, unspecified: Secondary | ICD-10-CM | POA: Diagnosis not present

## 2015-07-16 DIAGNOSIS — I10 Essential (primary) hypertension: Secondary | ICD-10-CM | POA: Insufficient documentation

## 2015-07-16 DIAGNOSIS — J449 Chronic obstructive pulmonary disease, unspecified: Secondary | ICD-10-CM | POA: Insufficient documentation

## 2015-07-16 DIAGNOSIS — F209 Schizophrenia, unspecified: Secondary | ICD-10-CM | POA: Insufficient documentation

## 2015-07-16 MED ORDER — OXYCODONE-ACETAMINOPHEN 5-325 MG PO TABS: 1.0000 | ORAL_TABLET | Freq: Four times a day (QID) | ORAL | Status: DC | PRN

## 2015-07-16 MED ORDER — OXYCODONE-ACETAMINOPHEN 5-325 MG PO TABS
2.0000 | ORAL_TABLET | Freq: Once | ORAL | Status: AC
  Administered 2015-07-16: 2 via ORAL
  Filled 2015-07-16: qty 2

## 2015-07-16 NOTE — ED Provider Notes (Signed)
[facility] Emergency Department Provider Note  Time seen: 1:36 PM  I have reviewed the triage vital signs and the nursing notes.   HISTORY  Chief Complaint Testicle Pain    HPI Derrick Maldonado is a 45 y.o. male with a past medical history of hypertension, bipolar, COPD, hydrocele, who presents the emergency department with right-sided testicular pain. According to the patient he has been having right-sided scrotal pain for the past 2 weeks. Was seen here and 07/05/15 diagnosed with a hydrocele. Patient has follow-up with urology in 7/25, but states he is out of pain medication and it has been hurting worse over the past 2 days. Denies any dysuria, hematuria. Denies abdominal pain.     Past Medical History  Diagnosis Date  . Hypertension   . Bipolar disorder ([place])   . Schizophrenia ([place])   . Anxiety   . PTSD (post-traumatic stress disorder)   . Chronic obstructive pulmonary disease ([place]) 12/19/2012    Last Assessment & Plan:  Condition is worse Differential includes URI Medication and treatment plan as described in orders or med list. Patient education handout provided.   . Acid reflux 05/12/2012    Last Assessment & Plan:  Condition is unchanged Medication and treatment plan as described in orders. Patient education handout provided.   . 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 Continue with post-operative care; reviewed infection precautions   . Cervical spondylosis without myelopathy 05/07/2013  . Scar condition and fibrosis of skin 02/13/2005  . Benzodiazepine dependence ([place]) 10/16/2013  . Astigmatism, regular 12/12/2010  . Cannabis abuse 10/16/2013  . Cervical pain 05/07/2013  . Chronic pain associated with significant psychosocial dysfunction 07/21/2014  . Cocaine abuse 06/03/2013  . Clinical depression 05/26/2015  .  Intentional drug overdose ([place]) 02/16/2014  . Error, refractive, myopia 12/12/2010  . Episodic mood disorder ([place]) 10/10/2013    Patient Active Problem List   Diagnosis Date Noted  . Clinical depression 05/26/2015  . Chronic pain associated with significant psychosocial dysfunction 07/21/2014  . Intentional drug overdose ([place]) 02/16/2014  . Benzodiazepine dependence ([place]) 10/16/2013  . Cannabis abuse 10/16/2013  . Drug-induced mood disorder ([place]) 10/16/2013  . Episodic mood disorder ([place]) 10/10/2013  . Cocaine abuse 06/03/2013  . Cervical spondylosis without myelopathy 05/07/2013  . Cervical pain 05/07/2013  . Chronic obstructive pulmonary disease ([place]) 12/19/2012  . Current tobacco use 12/19/2012  . Acid reflux 05/12/2012  . 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 Error, refractive, myopia 12/12/2010  . Astigmatism, regular 12/12/2010  . Scar condition and fibrosis of skin 02/13/2005    Past Surgical History  Procedure Laterality Date  . Tympanoplasty    . Arm debridement    . Hernia repair      Current Outpatient Rx  Name  Route  Sig  Dispense  Refill  . amLODipine (NORVASC) 5 MG tablet   Oral   Take 5 mg by mouth daily.      3   . beclomethasone (QVAR) 40 MCG/ACT inhaler   Inhalation   Inhale 2 puffs into the lungs 2 (two) times daily.         Marland Kitchen doxepin (SINEQUAN) 50 MG capsule   Oral   Take 50 mg by mouth at bedtime.      2   .  escitalopram (LEXAPRO) 10 MG tablet   Oral   Take 10 mg by mouth daily.      2   . HYDROcodone-acetaminophen (NORCO) 5-325 MG tablet   Oral   Take 1-2 tablets by mouth every 6 (six) hours as needed for severe pain.   16 tablet   0   . EXPIRED: levalbuterol (XOPENEX HFA) 45 MCG/ACT inhaler   Inhalation   Inhale 2 puffs into the lungs every 4 (four) hours.         . naproxen (NAPROSYN) 500 MG tablet   Oral   Take 1 tablet (500 mg total) by mouth 2 (two) times daily. Patient  not taking: Reported on 05/26/2015   30 tablet   0   . omeprazole (PRILOSEC) 20 MG capsule   Oral   Take 20 mg by mouth 2 (two) times daily before a meal.          . oxyCODONE (OXY IR/ROXICODONE) 5 MG immediate release tablet   Oral   Take 5 mg by mouth every 4 (four) hours as needed for moderate pain or severe pain. Reported on 05/26/2015      0   . oxyCODONE-acetaminophen (PERCOCET/ROXICET) 5-325 MG tablet   Oral   Take 1-2 tablets by mouth every 4 (four) hours as needed for severe pain. Patient not taking: Reported on 05/26/2015   6 tablet   0   . polyethylene glycol (MIRALAX / GLYCOLAX) packet   Oral   Take 17 g by mouth daily as needed for mild constipation or moderate constipation.         . ziprasidone (GEODON) 80 MG capsule   Oral   Take 80 mg by mouth 2 (two) times daily with a meal.       2     Allergies Morphine  No family history on file.  Social History Social History  Substance Use Topics  . Smoking status: Current Every Day Smoker -- 0.50 packs/day    Types: Cigarettes  . Smokeless tobacco: Never Used  . Alcohol Use: No    Review of Systems Constitutional: Negative for fever Cardiovascular: Negative for chest pain. Respiratory: Negative for shortness of breath. Gastrointestinal: Negative for abdominal pain. Genitourinary: Negative for dysuria. Positive for right scrotal pain. Neurological: Negative for headache 10-point ROS otherwise negative.  ____________________________________________   PHYSICAL EXAM:  VITAL SIGNS: ED Triage Vitals  Enc Vitals Group     BP 07/16/15 1306 143/95 mmHg     Pulse Rate 07/16/15 1306 97     Resp 07/16/15 1306 16     Temp 07/16/15 1306 98.1 F (36.7 C)     Temp Source 07/16/15 1306 Oral     SpO2 07/16/15 1306 98 %     Weight 07/16/15 1306 214 lb (97.07 kg)     Height 07/16/15 1306 5\' 10"  (1.778 m)     Head Cir --      Peak Flow --      Pain Score 07/16/15 1306 10     Pain Loc --      Pain Edu?  --      Excl. in [place]? --     Constitutional: Alert and oriented. Well appearing and in no distress. Eyes: Normal exam ENT   Head: Normocephalic and atraumatic   Mouth/Throat: Mucous membranes are moist. Cardiovascular: Normal rate, regular rhythm. No murmur Respiratory: Normal respiratory effort without tachypnea nor retractions. Breath sounds are clear  Gastrointestinal: Soft, nontender, no distention. GU exam shows a  enlarged right scrotum/right testicle, moderate tenderness to palpation, no skin color changes. Normal left testicle. Normal penis. Musculoskeletal: Nontender with normal range of motion in all extremities. Neurologic:  Normal speech and language. No gross focal neurologic deficits  Skin:  Skin is warm, dry and intact.  Psychiatric: Mood and affect are normal.   ____________________________________________   RADIOLOGY  Ultrasound consistent with bilateral hydrocele.  ____________________________________________    INITIAL IMPRESSION / ASSESSMENT AND PLAN / ED COURSE  Pertinent labs & imaging results that were available during my care of the patient were reviewed by me and considered in my medical decision making (see chart for details).  Patient presents for increased right-sided scrotal pain, diagnosed with a hydrocele 10 days ago. We will repeat an ultrasound to further evaluate. We'll treat pain medication. If the ultrasound is unchanged we will discharge with pain medication until the patient can see urology 7/25 as scheduled.  Ultrasound CONSISTENT WITH BILATERAL HYDROCELE. WE'LL DISCHARGE WITH HIS WORKERS PAIN MEDICATION. I INFORMED THE PATIENT THAT HE NEEDS TO FOLLOW-UP WITH HIS DOCTOR OR UROLOGY AS SCHEDULED, AND THAT WE CAN NO LONGER PROVIDE PAIN MEDICATION AFTER THIS VISIT. PATIENT IS UNDERSTANDING WILL FOLLOW-UP WITH UROLOGY 7/25. ____________________________________________   FINAL CLINICAL IMPRESSION(S) / ED DIAGNOSES  Scrotal  pain Hydrocele  Harvest Dark, MD 07/16/15 1444

## 2015-07-16 NOTE — ED Notes (Signed)
Pt reports right testicle hydrocele; supposed to have drained on 26th. Pt reports pain increased.

## 2015-07-16 NOTE — ED Notes (Signed)
Patient currently in ultrasound.

## 2015-07-16 NOTE — ED Notes (Addendum)
Patient states that he has right testicular hydrocele, patient has an appt to get it drained on 7/25, patient has been having increased pain. Patient seen in our ED 7/4 and given pain medication. Patient was supposed to see his MD last week but they called him and rescheduled the appt for 7/25. Denies any trouble passing his urine.

## 2015-07-16 NOTE — ED Notes (Signed)
Patient returned from ultrasound.

## 2015-08-13 ENCOUNTER — Emergency Department: Payer: Medicaid Other

## 2015-08-13 ENCOUNTER — Emergency Department
Admission: EM | Admit: 2015-08-13 | Discharge: 2015-08-13 | Disposition: A | Discharge status: Home / Self Care | Payer: Medicaid Other | Attending: Student in an Organized Health Care Education/Training Program | Admitting: Student in an Organized Health Care Education/Training Program

## 2015-08-13 ENCOUNTER — Encounter: Payer: Self-pay | Admitting: *Deleted

## 2015-08-13 DIAGNOSIS — Z791 Long term (current) use of non-steroidal anti-inflammatories (NSAID): Secondary | ICD-10-CM | POA: Insufficient documentation

## 2015-08-13 DIAGNOSIS — Z79899 Other long term (current) drug therapy: Secondary | ICD-10-CM | POA: Diagnosis not present

## 2015-08-13 DIAGNOSIS — F17210 Nicotine dependence, cigarettes, uncomplicated: Secondary | ICD-10-CM | POA: Insufficient documentation

## 2015-08-13 DIAGNOSIS — R319 Hematuria, unspecified: Secondary | ICD-10-CM | POA: Diagnosis not present

## 2015-08-13 DIAGNOSIS — R1031 Right lower quadrant pain: Secondary | ICD-10-CM | POA: Diagnosis not present

## 2015-08-13 DIAGNOSIS — J449 Chronic obstructive pulmonary disease, unspecified: Secondary | ICD-10-CM | POA: Insufficient documentation

## 2015-08-13 DIAGNOSIS — I10 Essential (primary) hypertension: Secondary | ICD-10-CM | POA: Insufficient documentation

## 2015-08-13 DIAGNOSIS — R109 Unspecified abdominal pain: Secondary | ICD-10-CM

## 2015-08-13 LAB — COMPREHENSIVE METABOLIC PANEL
ALBUMIN: 3.9 g/dL (ref 3.5–5.0)
ALK PHOS: 77 U/L (ref 38–126)
ALT: 29 U/L (ref 17–63)
AST: 36 U/L (ref 15–41)
Anion gap: 6 (ref 5–15)
BILIRUBIN TOTAL: 0.8 mg/dL (ref 0.3–1.2)
BUN: 10 mg/dL (ref 6–20)
CALCIUM: 9.2 mg/dL (ref 8.9–10.3)
CO2: 27 mmol/L (ref 22–32)
Chloride: 107 mmol/L (ref 101–111)
Creatinine, Ser: 1.12 mg/dL (ref 0.61–1.24)
GFR calc Af Amer: >60 mL/min (ref >60)
GFR calc non Af Amer: >60 mL/min (ref >60)
GLUCOSE: 107 mg/dL — AB (ref 65–99)
Potassium: 4.3 mmol/L (ref 3.5–5.1)
Sodium: 140 mmol/L (ref 135–145)
TOTAL PROTEIN: 7.3 g/dL (ref 6.5–8.1)

## 2015-08-13 LAB — URINALYSIS COMPLETE WITH MICROSCOPIC (ARMC ONLY)
Bilirubin Urine: NEGATIVE
Glucose, UA: NEGATIVE mg/dL
Ketones, ur: NEGATIVE mg/dL
Leukocytes, UA: NEGATIVE
Nitrite: NEGATIVE
Protein, ur: NEGATIVE mg/dL
Specific Gravity, Urine: 1.02 (ref 1.005–1.030)
pH: 6 (ref 5.0–8.0)

## 2015-08-13 LAB — CBC
HCT: 44.4 % (ref 40.0–52.0)
Hemoglobin: 15.5 g/dL (ref 13.0–18.0)
MCH: 32.1 pg (ref 26.0–34.0)
MCHC: 35 g/dL (ref 32.0–36.0)
MCV: 91.7 fL (ref 80.0–100.0)
Platelets: 179 10*3/uL (ref 150–440)
RBC: 4.84 MIL/uL (ref 4.40–5.90)
RDW: 12.7 % (ref 11.5–14.5)
WBC: 10.7 10*3/uL — ABNORMAL HIGH (ref 3.8–10.6)

## 2015-08-13 LAB — LIPASE, BLOOD: Lipase: 26 U/L (ref 11–51)

## 2015-08-13 MED ORDER — FENTANYL CITRATE (PF) 100 MCG/2ML IJ SOLN
100.0000 ug | INTRAMUSCULAR | Status: DC | PRN
  Administered 2015-08-13: 100 ug via INTRAVENOUS
  Filled 2015-08-13: qty 2

## 2015-08-13 MED ORDER — TAMSULOSIN HCL 0.4 MG PO CAPS: 0.4000 mg | ORAL_CAPSULE | Freq: Every day | ORAL | 0 refills | Status: DC

## 2015-08-13 MED ORDER — DIATRIZOATE MEGLUMINE & SODIUM 66-10 % PO SOLN
15.0000 mL | ORAL | Status: AC
  Administered 2015-08-13: 15 mL via ORAL

## 2015-08-13 MED ORDER — PROMETHAZINE HCL 12.5 MG PO TABS: 12.5000 mg | ORAL_TABLET | Freq: Four times a day (QID) | ORAL | 0 refills | Status: DC | PRN

## 2015-08-13 MED ORDER — HYDROCODONE-ACETAMINOPHEN 5-325 MG PO TABS: 2.0000 | ORAL_TABLET | Freq: Four times a day (QID) | ORAL | 0 refills | Status: DC | PRN

## 2015-08-13 MED ORDER — SODIUM CHLORIDE 0.9 % IV BOLUS (SEPSIS)
1000.0000 mL | Freq: Once | INTRAVENOUS | Status: AC
  Administered 2015-08-13: 1000 mL via INTRAVENOUS

## 2015-08-13 MED ORDER — KETOROLAC TROMETHAMINE 30 MG/ML IJ SOLN
30.0000 mg | Freq: Once | INTRAMUSCULAR | Status: AC
Start: 2015-08-13 — End: 2015-08-13
  Administered 2015-08-13: 30 mg via INTRAVENOUS
  Filled 2015-08-13: qty 1

## 2015-08-13 MED ORDER — IOPAMIDOL (ISOVUE-300) INJECTION 61%
100.0000 mL | Freq: Once | INTRAVENOUS | Status: AC | PRN
  Administered 2015-08-13: 100 mL via INTRAVENOUS

## 2015-08-13 NOTE — ED Notes (Signed)
Waiting for fluids to finish before discharging pt.

## 2015-08-13 NOTE — ED Triage Notes (Signed)
Pt reports having right sided groin pain for the last 2 days with diarrhea, pt had hernia surgery 5 months ago, pt denies any other symptoms

## 2015-08-13 NOTE — ED Provider Notes (Signed)
[facility] Emergency Department Provider Note    First MD Initiated Contact with Patient 08/13/15 1357     (approximate)  I have reviewed the triage vital signs and the nursing notes.   HISTORY  Chief Complaint Groin Pain    HPI Koen Kressin is a 45 y.o. male  5 months status post right hernia repair presents with 2 days of worsening right lower quadrant pain associated with diarrhea.  Currently rates the pain as a 5 out of 10 in severity and reports it is a dull aching pressure in character.  States she was coughing 2 days ago and felt a pop with worsening pain radiating to his right testicle. Denies any hematuria. No dysuria. Has had 2 episodes of nonbloody diarrhea. Reports low-grade chills but no fevers. Denies any movement of the abdominal pain. Denies any nausea or vomiting.   Past Medical History:  Diagnosis Date  . Acid reflux 05/12/2012   Last Assessment & Plan:  Condition is unchanged Medication and treatment plan as described in orders. Patient education handout provided.   . Anxiety   . Astigmatism, regular 12/12/2010  . 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 Continue with post-operative care; reviewed infection precautions   . Benzodiazepine dependence ([place]) 10/16/2013  . Bipolar disorder ([place])   . Cannabis abuse 10/16/2013  . Cervical pain 05/07/2013  . Cervical spondylosis without myelopathy 05/07/2013  . Chronic obstructive pulmonary disease ([place]) 12/19/2012   Last Assessment & Plan:  Condition is worse Differential includes URI Medication and treatment plan as described in orders or med list. Patient education handout provided.   . Chronic pain associated with significant psychosocial dysfunction 07/21/2014  . Clinical depression 05/26/2015  . Cocaine abuse 06/03/2013  . Episodic mood disorder ([place])  10/10/2013  . Error, refractive, myopia 12/12/2010  . Hypertension   . Intentional drug overdose ([place]) 02/16/2014  . PTSD (post-traumatic stress disorder)   . Scar condition and fibrosis of skin 02/13/2005  . Schizophrenia [facility])     Patient Active Problem List   Diagnosis Date Noted  . Clinical depression 05/26/2015  . Chronic pain associated with significant psychosocial dysfunction 07/21/2014  . Intentional drug overdose ([place]) 02/16/2014  . Benzodiazepine dependence ([place]) 10/16/2013  . Cannabis abuse 10/16/2013  . Drug-induced mood disorder ([place]) 10/16/2013  . Episodic mood disorder ([place]) 10/10/2013  . Cocaine abuse 06/03/2013  . Cervical spondylosis without myelopathy 05/07/2013  . Cervical pain 05/07/2013  . Chronic obstructive pulmonary disease ([place]) 12/19/2012  . Current tobacco use 12/19/2012  . Acid reflux 05/12/2012  . 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 Error, refractive, myopia 12/12/2010  . Astigmatism, regular 12/12/2010  . Scar condition and fibrosis of skin 02/13/2005    Past Surgical History:  Procedure Laterality Date  . ARM DEBRIDEMENT    . HERNIA REPAIR    . TYMPANOPLASTY      Prior to Admission medications   Medication Sig Start Date End Date Taking? Authorizing Provider  amLODipine (NORVASC) 5 MG tablet Take 5 mg by mouth daily. 04/27/15   Historical Provider, MD  beclomethasone (QVAR) 40 MCG/ACT inhaler Inhale 2 puffs into the lungs 2 (two) times daily.    Historical Provider, MD  doxepin (SINEQUAN) 50 MG capsule Take 50 mg by mouth at bedtime. 04/19/15  Historical Provider, MD  escitalopram (LEXAPRO) 10 MG tablet Take 10 mg by mouth daily. 05/02/15   Historical Provider, MD  HYDROcodone-acetaminophen (NORCO) 5-325 MG tablet Take 1-2 tablets by mouth every 6 (six) hours as needed for severe pain. 07/05/15   Jami L Hagler, PA-C  levalbuterol (XOPENEX HFA) 45 MCG/ACT inhaler Inhale 2 puffs into the lungs every 4  (four) hours. 06/14/14 06/14/15  Historical Provider, MD  naproxen (NAPROSYN) 500 MG tablet Take 1 tablet (500 mg total) by mouth 2 (two) times daily. Patient not taking: Reported on 05/26/2015 05/22/15   Helane Gunther Joy, PA-C  omeprazole (PRILOSEC) 20 MG capsule Take 20 mg by mouth 2 (two) times daily before a meal.  03/02/15   Historical Provider, MD  oxyCODONE (OXY IR/ROXICODONE) 5 MG immediate release tablet Take 5 mg by mouth every 4 (four) hours as needed for moderate pain or severe pain. Reported on 05/26/2015 05/02/15   Historical Provider, MD  oxyCODONE-acetaminophen (ROXICET) 5-325 MG tablet Take 1 tablet by mouth every 6 (six) hours as needed. 07/16/15   Harvest Dark, MD  polyethylene glycol [facility] Raveling) packet Take 17 g by mouth daily as needed for mild constipation or moderate constipation.    Historical Provider, MD  ziprasidone (GEODON) 80 MG capsule Take 80 mg by mouth 2 (two) times daily with a meal.  05/08/15   Historical Provider, MD    Allergies Morphine  No family history on file.  Social History Social History  Substance Use Topics  . Smoking status: Current Every Day Smoker    Packs/day: 0.50    Types: Cigarettes  . Smokeless tobacco: Never Used  . Alcohol use No    Review of Systems Patient denies headaches, rhinorrhea, blurry vision, numbness, shortness of breath, chest pain, edema, cough, abdominal pain, nausea, vomiting, diarrhea, dysuria, fevers, rashes or hallucinations unless otherwise stated above in HPI. ____________________________________________   PHYSICAL EXAM:  VITAL SIGNS: Vitals:   08/13/15 1250 08/13/15 1358  BP: 126/82 (!) 131/92  Pulse: 79 64  Resp: 18 16  Temp: 98.3 F (36.8 C)     Constitutional: Alert and oriented. Well appearing and in no acute distress. Eyes: Conjunctivae are normal. PERRL. EOMI. Head: Atraumatic. Nose: No congestion/rhinnorhea. Mouth/Throat: Mucous membranes are moist.  Oropharynx non-erythematous. Neck: No  stridor. Painless ROM. No cervical spine tenderness to palpation Hematological/Lymphatic/Immunilogical: No cervical lymphadenopathy. Cardiovascular: Normal rate, regular rhythm. Grossly normal heart sounds.  Good peripheral circulation. Respiratory: Normal respiratory effort.  No retractions. Lungs CTAB. Gastrointestinal: Soft, RLQ ttp and guarding, no masses appreciated No distention. No abdominal bruits. No CVA tenderness. Genitourinary: normal descended teses, Musculoskeletal: No lower extremity tenderness nor edema.  No joint effusions. Neurologic:  Normal speech and language. No gross focal neurologic deficits are appreciated. No gait instability. Skin:  Skin is warm, dry and intact. No rash noted. Psychiatric: Mood and affect are normal. Speech and behavior are normal. ____________________________________________   LABS (all labs ordered are listed, but only abnormal results are displayed)  Results for orders placed or performed during the hospital encounter of 08/13/15 (from the past 24 hour(s))  Lipase, blood     Status: None   Collection Time: 08/13/15 12:48 PM  Result Value Ref Range   Lipase 26 11 - 51 U/L  Comprehensive metabolic panel     Status: Abnormal   Collection Time: 08/13/15 12:48 PM  Result Value Ref Range   Sodium 140 135 - 145 mmol/L   Potassium 4.3 3.5 - 5.1 mmol/L  Chloride 107 101 - 111 mmol/L   CO2 27 22 - 32 mmol/L   Glucose, Bld 107 (H) 65 - 99 mg/dL   BUN 10 6 - 20 mg/dL   Creatinine, Ser 1.12 0.61 - 1.24 mg/dL   Calcium 9.2 8.9 - 10.3 mg/dL   Total Protein 7.3 6.5 - 8.1 g/dL   Albumin 3.9 3.5 - 5.0 g/dL   AST 36 15 - 41 U/L   ALT 29 17 - 63 U/L   Alkaline Phosphatase 77 38 - 126 U/L   Total Bilirubin 0.8 0.3 - 1.2 mg/dL   GFR calc non Af Amer >60 >60 mL/min   GFR calc Af Amer >60 >60 mL/min   Anion gap 6 5 - 15  CBC     Status: Abnormal   Collection Time: 08/13/15 12:48 PM  Result Value Ref Range   WBC 10.7 (H) 3.8 - 10.6 K/uL   RBC 4.84  4.40 - 5.90 MIL/uL   Hemoglobin 15.5 13.0 - 18.0 g/dL   HCT 44.4 40.0 - 52.0 %   MCV 91.7 80.0 - 100.0 fL   MCH 32.1 26.0 - 34.0 pg   MCHC 35.0 32.0 - 36.0 g/dL   RDW 12.7 11.5 - 14.5 %   Platelets 179 150 - 440 K/uL   ____________________________________________  EKG  _______________________________  RADIOLOGY  IMPRESSION: No evidence of inguinal hernia or mass.  Short segment small bowel intussusception in the left abdomen. These are not typically transient and self-limited. Consider continued followup by CT in several weeks versus capsule endoscopy.  Bilateral nonobstructive nephrolithiasis. No evidence of ureteral calculi or hydronephrosis.  Aortic atherosclerosis noted. ____________________________________________   PROCEDURES  Procedure(s) performed: none    Critical Care performed: no ____________________________________________   INITIAL IMPRESSION / ASSESSMENT AND PLAN / ED COURSE  Pertinent labs & imaging results that were available during my care of the patient were reviewed by me and considered in my medical decision making (see chart for details).  DDX: Hernia, postop complication, diverticulitis, abscess, colitis, constipation, muscle strain  Ulas Metzker is a 44 y.o. who presents to the ED with Acute onset right inguinal abdominal pain. Patient arrives afebrile and hemodynamically stable. His abdominal exam does have mild right lower quadrant tenderness to palpation and he several months status post right hernia repair. We'll check labs for evidence of electrolyte abnormality or acute infectious process. Based on his exam will order CT imaging to evaluate for postop complication.  Clinical Course  Comment By Time  CT imaging reviewed and shows no evidence of hernia or postop complication. He does have your hematuria on it without evidence of infection and CT imaging does show bilateral nonobstructing kidney stones. Repeat abdominal exam is soft  and benign. I do feel his presentation is more consistent with acute nephrolithiasis without infection. Patient is hemodynamic stable. I feel is appropriate for outpatient follow-up.  Have discussed with the patient and available family all diagnostics and treatments performed thus far and all questions were answered to the best of my ability. The patient demonstrates understanding and agreement with plan. Merlyn Lot, MD 08/12 1752     ____________________________________________   FINAL CLINICAL IMPRESSION(S) / ED DIAGNOSES  Final diagnoses:  Abdominal pain  Hematuria      NEW MEDICATIONS STARTED DURING THIS VISIT:  New Prescriptions   No medications on file     Note:  This document was prepared using Dragon voice recognition software and may include unintentional dictation errors.     Saralyn Pilar  Quentin Cornwall, MD 08/13/15 1756

## 2015-08-13 NOTE — ED Notes (Signed)
Pt transported to CT ?

## 2015-08-27 ENCOUNTER — Emergency Department: Payer: Medicaid Other

## 2015-08-27 ENCOUNTER — Encounter: Payer: Self-pay | Admitting: Emergency Medicine

## 2015-08-27 ENCOUNTER — Emergency Department
Admission: EM | Admit: 2015-08-27 | Discharge: 2015-08-27 | Disposition: A | Discharge status: Home / Self Care | Payer: Medicaid Other | Attending: Emergency Medicine | Admitting: Emergency Medicine

## 2015-08-27 DIAGNOSIS — R109 Unspecified abdominal pain: Secondary | ICD-10-CM | POA: Insufficient documentation

## 2015-08-27 DIAGNOSIS — J449 Chronic obstructive pulmonary disease, unspecified: Secondary | ICD-10-CM | POA: Insufficient documentation

## 2015-08-27 DIAGNOSIS — R300 Dysuria: Secondary | ICD-10-CM | POA: Insufficient documentation

## 2015-08-27 DIAGNOSIS — I10 Essential (primary) hypertension: Secondary | ICD-10-CM | POA: Diagnosis not present

## 2015-08-27 DIAGNOSIS — R197 Diarrhea, unspecified: Secondary | ICD-10-CM | POA: Insufficient documentation

## 2015-08-27 DIAGNOSIS — F17210 Nicotine dependence, cigarettes, uncomplicated: Secondary | ICD-10-CM | POA: Insufficient documentation

## 2015-08-27 DIAGNOSIS — Z79899 Other long term (current) drug therapy: Secondary | ICD-10-CM | POA: Diagnosis not present

## 2015-08-27 DIAGNOSIS — Z791 Long term (current) use of non-steroidal anti-inflammatories (NSAID): Secondary | ICD-10-CM | POA: Insufficient documentation

## 2015-08-27 DIAGNOSIS — R1030 Lower abdominal pain, unspecified: Secondary | ICD-10-CM | POA: Diagnosis present

## 2015-08-27 LAB — URINALYSIS COMPLETE WITH MICROSCOPIC (ARMC ONLY)
Bilirubin Urine: NEGATIVE
GLUCOSE, UA: NEGATIVE mg/dL
Hgb urine dipstick: NEGATIVE
Ketones, ur: NEGATIVE mg/dL
Leukocytes, UA: NEGATIVE
Nitrite: NEGATIVE
PROTEIN: NEGATIVE mg/dL
SQUAMOUS EPITHELIAL / LPF: NONE SEEN
Specific Gravity, Urine: 1.017 (ref 1.005–1.030)
pH: 5 (ref 5.0–8.0)

## 2015-08-27 LAB — COMPREHENSIVE METABOLIC PANEL
ALK PHOS: 84 U/L (ref 38–126)
ALT: 39 U/L (ref 17–63)
AST: 44 U/L — AB (ref 15–41)
Albumin: 3.8 g/dL (ref 3.5–5.0)
Anion gap: 5 (ref 5–15)
BUN: 11 mg/dL (ref 6–20)
CALCIUM: 9.2 mg/dL (ref 8.9–10.3)
CO2: 28 mmol/L (ref 22–32)
CREATININE: 1.18 mg/dL (ref 0.61–1.24)
Chloride: 106 mmol/L (ref 101–111)
Glucose, Bld: 114 mg/dL — ABNORMAL HIGH (ref 65–99)
Potassium: 4 mmol/L (ref 3.5–5.1)
Sodium: 139 mmol/L (ref 135–145)
Total Bilirubin: 0.5 mg/dL (ref 0.3–1.2)
Total Protein: 7.2 g/dL (ref 6.5–8.1)

## 2015-08-27 LAB — CBC WITH DIFFERENTIAL/PLATELET
BASOS ABS: 0.1 10*3/uL (ref 0–0.1)
Basophils Relative: 1 %
Eosinophils Absolute: 0.2 10*3/uL (ref 0–0.7)
Eosinophils Relative: 2 %
HEMATOCRIT: 46 % (ref 40.0–52.0)
HEMOGLOBIN: 15.8 g/dL (ref 13.0–18.0)
LYMPHS PCT: 27 %
Lymphs Abs: 2.5 10*3/uL (ref 1.0–3.6)
MCH: 31.8 pg (ref 26.0–34.0)
MCHC: 34.4 g/dL (ref 32.0–36.0)
MCV: 92.2 fL (ref 80.0–100.0)
Monocytes Absolute: 0.9 10*3/uL (ref 0.2–1.0)
Monocytes Relative: 10 %
NEUTROS ABS: 5.5 10*3/uL (ref 1.4–6.5)
NEUTROS PCT: 60 %
PLATELETS: 175 10*3/uL (ref 150–440)
RBC: 4.99 MIL/uL (ref 4.40–5.90)
RDW: 12.6 % (ref 11.5–14.5)
WBC: 9.2 10*3/uL (ref 3.8–10.6)

## 2015-08-27 LAB — CHLAMYDIA/NGC RT PCR (ARMC ONLY)
Chlamydia Tr: NOT DETECTED
N gonorrhoeae: NOT DETECTED

## 2015-08-27 LAB — LIPASE, BLOOD: LIPASE: 34 U/L (ref 11–51)

## 2015-08-27 MED ORDER — SODIUM CHLORIDE 0.9 % IV BOLUS (SEPSIS)
1000.0000 mL | Freq: Once | INTRAVENOUS | Status: AC
  Administered 2015-08-27: 1000 mL via INTRAVENOUS

## 2015-08-27 MED ORDER — TAMSULOSIN HCL 0.4 MG PO CAPS
0.4000 mg | ORAL_CAPSULE | Freq: Once | ORAL | Status: AC
  Administered 2015-08-27: 0.4 mg via ORAL
  Filled 2015-08-27: qty 1

## 2015-08-27 MED ORDER — TAMSULOSIN HCL 0.4 MG PO CAPS: 0.4000 mg | ORAL_CAPSULE | Freq: Every day | ORAL | 0 refills | Status: DC

## 2015-08-27 MED ORDER — OXYCODONE-ACETAMINOPHEN 5-325 MG PO TABS
1.0000 | ORAL_TABLET | Freq: Once | ORAL | Status: AC
  Administered 2015-08-27: 1 via ORAL
  Filled 2015-08-27: qty 1

## 2015-08-27 MED ORDER — KETOROLAC TROMETHAMINE 30 MG/ML IJ SOLN
30.0000 mg | Freq: Once | INTRAMUSCULAR | Status: AC
  Administered 2015-08-27: 30 mg via INTRAVENOUS
  Filled 2015-08-27: qty 1

## 2015-08-27 NOTE — ED Provider Notes (Addendum)
[facility] Emergency Department Provider Note   ____________________________________________   None    (approximate)  I have reviewed the triage vital signs and the nursing notes.   HISTORY  Chief Complaint Flank Pain   HPI Derrick Maldonado is a 45 y.o. male with a history of acid reflux as well as schizophrenia who is presenting with recurrent right flank pain. He was diagnosed with kidney stone on August 12. He is returning now because of return of pain to the right lower back as well as the right groin. He says that also burns in the right groin when he urinates. He is also been experiencing diarrhea over the past week. Says he has had about 2 episodes per day. Denies any recent antibiotics. Says he has been taking Flomax to help pass kidney stones. Says his urine also per Dr. Darvin Neighbours thinks she saw some blood in there. He is concerned about not seeing stones pass.He says that the pain is sharp to his right lower back. He rates it as an 8 out of 10 at this time. Denies any nausea and vomiting.   Past Medical History:  Diagnosis Date  . Acid reflux 05/12/2012   Last Assessment & Plan:  Condition is unchanged Medication and treatment plan as described in orders. Patient education handout provided.   . Anxiety   . Astigmatism, regular 12/12/2010  . 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 Continue with post-operative care; reviewed infection precautions   . Benzodiazepine dependence ([place]) 10/16/2013  . Bipolar disorder ([place])   . Cannabis abuse 10/16/2013  . Cervical pain 05/07/2013  . Cervical spondylosis without myelopathy 05/07/2013  . Chronic obstructive pulmonary disease ([place]) 12/19/2012   Last Assessment & Plan:  Condition is worse Differential includes URI Medication and treatment plan as described in orders or med  list. Patient education handout provided.   . Chronic pain associated with significant psychosocial dysfunction 07/21/2014  . Clinical depression 05/26/2015  . Cocaine abuse 06/03/2013  . Episodic mood disorder ([place]) 10/10/2013  . Error, refractive, myopia 12/12/2010  . Hypertension   . Intentional drug overdose ([place]) 02/16/2014  . PTSD (post-traumatic stress disorder)   . Scar condition and fibrosis of skin 02/13/2005  . Schizophrenia [facility])     Patient Active Problem List   Diagnosis Date Noted  . Clinical depression 05/26/2015  . Chronic pain associated with significant psychosocial dysfunction 07/21/2014  . Intentional drug overdose ([place]) 02/16/2014  . Benzodiazepine dependence ([place]) 10/16/2013  . Cannabis abuse 10/16/2013  . Drug-induced mood disorder ([place]) 10/16/2013  . Episodic mood disorder ([place]) 10/10/2013  . Cocaine abuse 06/03/2013  . Cervical spondylosis without myelopathy 05/07/2013  . Cervical pain 05/07/2013  . Chronic obstructive pulmonary disease ([employer]) 12/19/2012  . Current tobacco use 12/19/2012  . Acid reflux 05/12/2012  . 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 Error, refractive, myopia 12/12/2010  . Astigmatism, regular 12/12/2010  . Scar condition and fibrosis of skin 02/13/2005    Past Surgical History:  Procedure Laterality Date  . ARM DEBRIDEMENT    . HERNIA REPAIR    . TYMPANOPLASTY      Prior to Admission medications   Medication Sig Start Date End Date Taking? Authorizing Provider  amLODipine (NORVASC) 5 MG tablet Take 5 mg by mouth daily. 04/27/15  Historical Provider, MD  beclomethasone (QVAR) 40 MCG/ACT inhaler Inhale 2 puffs into the lungs 2 (two) times daily.    Historical Provider, MD  doxepin (SINEQUAN) 50 MG capsule Take 50 mg by mouth at bedtime. 04/19/15   Historical Provider, MD  escitalopram (LEXAPRO) 10 MG tablet Take 10 mg by mouth daily. 05/02/15   Historical Provider, MD    HYDROcodone-acetaminophen (NORCO) 5-325 MG tablet Take 1-2 tablets by mouth every 6 (six) hours as needed for severe pain. 07/05/15   Jami L Hagler, PA-C  HYDROcodone-acetaminophen (NORCO) 5-325 MG tablet Take 2 tablets by mouth every 6 (six) hours as needed for moderate pain. 08/13/15   Merlyn Lot, MD  levalbuterol [facility] HFA) 45 MCG/ACT inhaler Inhale 2 puffs into the lungs every 4 (four) hours. 06/14/14 06/14/15  Historical Provider, MD  naproxen (NAPROSYN) 500 MG tablet Take 1 tablet (500 mg total) by mouth 2 (two) times daily. Patient not taking: Reported on 05/26/2015 05/22/15   Helane Gunther Joy, PA-C  omeprazole (PRILOSEC) 20 MG capsule Take 20 mg by mouth 2 (two) times daily before a meal.  03/02/15   Historical Provider, MD  oxyCODONE (OXY IR/ROXICODONE) 5 MG immediate release tablet Take 5 mg by mouth every 4 (four) hours as needed for moderate pain or severe pain. Reported on 05/26/2015 05/02/15   Historical Provider, MD  oxyCODONE-acetaminophen (ROXICET) 5-325 MG tablet Take 1 tablet by mouth every 6 (six) hours as needed. 07/16/15   Harvest Dark, MD  polyethylene glycol [facility] / Floria Raveling) packet Take 17 g by mouth daily as needed for mild constipation or moderate constipation.    Historical Provider, MD  promethazine (PHENERGAN) 12.5 MG tablet Take 1 tablet (12.5 mg total) by mouth every 6 (six) hours as needed for nausea or vomiting. 08/13/15   Merlyn Lot, MD  tamsulosin (FLOMAX) 0.4 MG CAPS capsule Take 1 capsule (0.4 mg total) by mouth daily after supper. 08/13/15   Merlyn Lot, MD  ziprasidone (GEODON) 80 MG capsule Take 80 mg by mouth 2 (two) times daily with a meal.  05/08/15   Historical Provider, MD    Allergies Morphine  History reviewed. No pertinent family history.  Social History Social History  Substance Use Topics  . Smoking status: Current Every Day Smoker    Packs/day: 0.50    Types: Cigarettes  . Smokeless tobacco: Never Used  . Alcohol use No     Review of Systems Constitutional: No fever/chills Eyes: No visual changes. ENT: No sore throat. Cardiovascular: Denies chest pain. Respiratory: Denies shortness of breath. Gastrointestinal: No nausea, no vomiting.   No constipation. Genitourinary: As above Musculoskeletal: Negative for back pain. Skin: Negative for rash. Neurological: Negative for headaches, focal weakness or numbness.  10-point ROS otherwise negative.  ____________________________________________   PHYSICAL EXAM:  VITAL SIGNS: ED Triage Vitals  Enc Vitals Group     BP 08/27/15 1038 (!) 126/93     Pulse Rate 08/27/15 1038 (!) 110     Resp 08/27/15 1038 20     Temp 08/27/15 1038 98.3 F (36.8 C)     Temp Source 08/27/15 1038 Oral     SpO2 --      Weight 08/27/15 1039 220 lb (99.8 kg)     Height 08/27/15 1039 5\' 10"  (1.778 m)     Head Circumference --      Peak Flow --      Pain Score 08/27/15 1039 8     Pain Loc --  Pain Edu? --      Excl. in [place]? --     Constitutional: Alert and oriented. Well appearing and in no acute distress. Eyes: Conjunctivae are normal. PERRL. EOMI. Head: Atraumatic. Nose: No congestion/rhinnorhea. Mouth/Throat: Mucous membranes are moist.   Neck: No stridor.   Cardiovascular: Normal rate, regular rhythm. Grossly normal heart sounds.   Respiratory: Normal respiratory effort.  No retractions. Lungs CTAB. Gastrointestinal: Soft and nontender.Over the right groin there is no palpable hernia sac. No protruding hernia. Patient says it "burns" when I palpate the right inguinal area. No distention.  No CVA tenderness. Musculoskeletal: No lower extremity tenderness nor edema.  No joint effusions. Neurologic:  Normal speech and language. No gross focal neurologic deficits are appreciated.  Skin:  Skin is warm, dry and intact. No rash noted. Psychiatric: Mood and affect are normal. Speech and behavior are normal.  ____________________________________________   LABS (all  labs ordered are listed, but only abnormal results are displayed)  Labs Reviewed  URINALYSIS COMPLETEWITH MICROSCOPIC ([place]) - Abnormal; Notable for the following:       Result Value   Color, Urine YELLOW (*)    APPearance CLEAR (*)    Bacteria, UA RARE (*)    All other components within normal limits  COMPREHENSIVE METABOLIC PANEL - Abnormal; Notable for the following:    Glucose, Bld 114 (*)    AST 44 (*)    All other components within normal limits  CBC WITH DIFFERENTIAL/PLATELET  LIPASE, BLOOD   ____________________________________________  EKG   ____________________________________________  RADIOLOGY  US Renal (Accession [id number]) (Order [id number])  Imaging  Date: 08/27/2015 Department: [facility] EMERGENCY DEPARTMENT Released By/Authorizing: Orbie Pyo, MD (auto-released)  PACS Images   Show images for US Renal  Study Result   CLINICAL DATA:  Right flank pain  EXAM: RENAL / URINARY TRACT ULTRASOUND COMPLETE  COMPARISON:  CT 12/2015.  Ultrasound 07/16/2015  FINDINGS: Right Kidney:  Length: 10.4 cm. No hydronephrosis or mass. Normal echotexture. 9 mm calcification in the mid pole, nonobstructing.  Left Kidney:  Length: 11.3 cm. No hydronephrosis or mass. Normal echotexture. Multiple shadowing calculi, the largest 8 mm.  Bladder:  Appears normal for degree of bladder distention.  IMPRESSION: Bilateral nephrolithiasis, nonobstructing. No hydronephrosis. No acute findings.   Electronically Signed   By: Rolm Baptise M.D.   On: 08/27/2015 12:57    ____________________________________________   PROCEDURES  Procedure(s) performed:   Procedures  Critical Care performed:   ____________________________________________   INITIAL IMPRESSION / ASSESSMENT AND PLAN / ED COURSE  Pertinent labs & imaging results that were available during my care of the patient were reviewed by me and considered  in my medical decision making (see chart for details).  Patient possibly passed stone this morning. No blood in the urine. No signs of infection on his urinalysis. Nontender to the right lumbar area. Unclear cause of the patient's pain at this time. Will check blood work as well as ultrasound renal.  Clinical Course    ----------------------------------------- 1:50 PM on 08/27/2015 -----------------------------------------  Patient resting comfortably at this time without any signs of acute distress. Still says he is in pain but no objective findings. Vital signs are reassuring as well as his workup. No obstructing stones on his ultrasound. Says that he ran out of his Norco as well as Flomax several days ago. No hematuria at this time the patient with multiple renal stones. Possible that he had passed a stone earlier today. We'll restart  the patient on Flomax. Due to the patient's multiple opiate prescriptions over the past several months I told him I would not be able to give a prescription to go home with but he requested Percocet before leaving which he will get.  Patient has follow-up with urology on September 7. In the meantime, recommend that he follow with his primary care doctor. We also discussed return precautions and to come back to the emergency department for any worsening or concerning symptoms. He is understanding of this plan and willing to comply. ____________________________________________   FINAL CLINICAL IMPRESSION(S) / ED DIAGNOSES  Right flank pain. Dysuria.    NEW MEDICATIONS STARTED DURING THIS VISIT:  New Prescriptions   No medications on file     Note:  This document was prepared using Dragon voice recognition software and may include unintentional dictation errors.    Orbie Pyo, MD 08/27/15 1352  Unclear cause of the patient's diarrhea but no episodes while he was in the emergency department. Only 2 episodes per day and there is  antibiotics. For that he is very low risk for a serious causes of diarrhea such as C. difficile.   Orbie Pyo, MD 08/27/15 1352

## 2015-08-27 NOTE — ED Triage Notes (Signed)
Patient from home via POV with complaint of right sided flank pain and burning with urination. States he was seen here prior for non-obstructing kidney stone By [employer] md

## 2015-11-05 ENCOUNTER — Encounter: Payer: Self-pay | Admitting: Urgent Care

## 2015-11-05 ENCOUNTER — Emergency Department
Admission: EM | Admit: 2015-11-05 | Discharge: 2015-11-06 | Disposition: A | Discharge status: Home / Self Care | Payer: Medicaid Other | Attending: Emergency Medicine | Admitting: Emergency Medicine

## 2015-11-05 DIAGNOSIS — S59911A Unspecified injury of right forearm, initial encounter: Secondary | ICD-10-CM | POA: Diagnosis present

## 2015-11-05 DIAGNOSIS — Z23 Encounter for immunization: Secondary | ICD-10-CM | POA: Insufficient documentation

## 2015-11-05 DIAGNOSIS — I10 Essential (primary) hypertension: Secondary | ICD-10-CM | POA: Insufficient documentation

## 2015-11-05 DIAGNOSIS — M436 Torticollis: Secondary | ICD-10-CM

## 2015-11-05 DIAGNOSIS — S50812A Abrasion of left forearm, initial encounter: Secondary | ICD-10-CM | POA: Insufficient documentation

## 2015-11-05 DIAGNOSIS — Y990 Civilian activity done for income or pay: Secondary | ICD-10-CM | POA: Insufficient documentation

## 2015-11-05 DIAGNOSIS — Z79899 Other long term (current) drug therapy: Secondary | ICD-10-CM | POA: Diagnosis not present

## 2015-11-05 DIAGNOSIS — Y929 Unspecified place or not applicable: Secondary | ICD-10-CM | POA: Diagnosis not present

## 2015-11-05 DIAGNOSIS — F17210 Nicotine dependence, cigarettes, uncomplicated: Secondary | ICD-10-CM | POA: Diagnosis not present

## 2015-11-05 DIAGNOSIS — S50811A Abrasion of right forearm, initial encounter: Secondary | ICD-10-CM | POA: Diagnosis not present

## 2015-11-05 DIAGNOSIS — Y9389 Activity, other specified: Secondary | ICD-10-CM | POA: Insufficient documentation

## 2015-11-05 DIAGNOSIS — X58XXXA Exposure to other specified factors, initial encounter: Secondary | ICD-10-CM | POA: Insufficient documentation

## 2015-11-05 DIAGNOSIS — T148XXA Other injury of unspecified body region, initial encounter: Secondary | ICD-10-CM

## 2015-11-05 NOTE — ED Triage Notes (Signed)
Patient presents to the ED tonight with /o LEFT neck pain. PMH significant for OA; normally experiences pain in RIGHT neck. Symptom present x 4 days; taking IBU with minimal relief. Patient denies acute injury. Also, patient working on a [employer] and sustained multiple areas of abrasion to BUE; requesting TDaP.

## 2015-11-05 NOTE — ED Notes (Signed)
Patient states that he has had pain to left side of his neck times four days. Patient states that he has chronic pain to the right side of his neck but has never had pain on the left side.

## 2015-11-06 MED ORDER — CYCLOBENZAPRINE HCL 10 MG PO TABS
5.0000 mg | ORAL_TABLET | Freq: Once | ORAL | Status: AC
  Administered 2015-11-06: 5 mg via ORAL
  Filled 2015-11-06: qty 1

## 2015-11-06 MED ORDER — KETOROLAC TROMETHAMINE 30 MG/ML IJ SOLN
60.0000 mg | Freq: Once | INTRAMUSCULAR | Status: AC
  Administered 2015-11-06: 60 mg via INTRAMUSCULAR
  Filled 2015-11-06: qty 2

## 2015-11-06 MED ORDER — NAPROXEN 500 MG PO TABS: 500.0000 mg | ORAL_TABLET | Freq: Two times a day (BID) | ORAL | 0 refills | Status: DC

## 2015-11-06 MED ORDER — CYCLOBENZAPRINE HCL 5 MG PO TABS: ORAL_TABLET | ORAL | 0 refills | Status: DC

## 2015-11-06 MED ORDER — TETANUS-DIPHTH-ACELL PERTUSSIS 5-2.5-18.5 LF-MCG/0.5 IM SUSP
0.5000 mL | Freq: Once | INTRAMUSCULAR | Status: AC
  Administered 2015-11-06: 0.5 mL via INTRAMUSCULAR
  Filled 2015-11-06: qty 0.5

## 2015-11-06 NOTE — Discharge Instructions (Signed)
1. Your tetanus has been updated and will be good for the next 10 years. 2. You may take medicines as needed for pain and muscle spasms (Naprosyn/Flexeril #15). 3. Apply moist heat to affected area several times daily. 4. Return to the ER for worsening symptoms, persistent vomiting, difficulty breathing or other concerns.

## 2015-11-06 NOTE — ED Provider Notes (Signed)
[facility] Emergency Department Provider Note   ____________________________________________   First MD Initiated Contact with Patient 11/05/15 2332     (approximate)  I have reviewed the triage vital signs and the nursing notes.   HISTORY  Chief Complaint Neck Pain and Abrasion    HPI Derrick Maldonado is a 45 y.o. male who presents to the ED from home with a chief complaint of left neck pain and requesting tetanus shot for abrasions on his forearms. Patient has a history of osteoarthritis and normally has pain in the right side of his neck. Reports a 4 day history of left-sided neck pain which he noticed after awakening.Symptoms worsened by turning or moving neck. Patient denies trauma, fall or injury. Requesting tetanus for abrasions to bilateral forearms while working on a car tonight. Denies recent fever, chills, headache, vision changes, chest pain, shortness of breath, abdominal pain, nausea, vomiting, diarrhea, numbness/tingling, extremity weakness. Denies recent travel or trauma. Taking ibuprofen with minimal relief.   Past Medical History:  Diagnosis Date  . Acid reflux 05/12/2012   Last Assessment & Plan:  Condition is unchanged Medication and treatment plan as described in orders. Patient education handout provided.   . Anxiety   . Astigmatism, regular 12/12/2010  . 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 Continue with post-operative care; reviewed infection precautions   . Benzodiazepine dependence ([place]) 10/16/2013  . Bipolar disorder ([place])   . Cannabis abuse 10/16/2013  . Cervical pain 05/07/2013  . Cervical spondylosis without myelopathy 05/07/2013  . Chronic obstructive pulmonary disease ([place]) 12/19/2012   Last Assessment & Plan:  Condition is worse Differential includes URI Medication and treatment plan  as described in orders or med list. Patient education handout provided.   . Chronic pain associated with significant psychosocial dysfunction 07/21/2014  . Clinical depression 05/26/2015  . Cocaine abuse 06/03/2013  . Episodic mood disorder ([place]) 10/10/2013  . Error, refractive, myopia 12/12/2010  . Hypertension   . Intentional drug overdose ([place]) 02/16/2014  . PTSD (post-traumatic stress disorder)   . Scar condition and fibrosis of skin 02/13/2005  . Schizophrenia [facility])     Patient Active Problem List   Diagnosis Date Noted  . Clinical depression 05/26/2015  . Chronic pain associated with significant psychosocial dysfunction 07/21/2014  . Intentional drug overdose ([place]) 02/16/2014  . Benzodiazepine dependence ([place]) 10/16/2013  . Cannabis abuse 10/16/2013  . Drug-induced mood disorder ([place]) 10/16/2013  . Episodic mood disorder ([place]) 10/10/2013  . Cocaine abuse 06/03/2013  . Cervical spondylosis without myelopathy 05/07/2013  . Cervical pain 05/07/2013  . Chronic obstructive pulmonary disease ([place]) 12/19/2012  . Current tobacco use 12/19/2012  . Acid reflux 05/12/2012  . 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 Error, refractive, myopia 12/12/2010  . Astigmatism, regular 12/12/2010  . Scar condition and fibrosis of skin 02/13/2005    Past Surgical History:  Procedure Laterality Date  . ARM DEBRIDEMENT    . HERNIA REPAIR    . TYMPANOPLASTY      Prior to Admission medications   Medication Sig Start Date End Date Taking? Authorizing Provider  amLODipine (NORVASC) 5 MG tablet Take 5 mg by mouth daily. 04/27/15   Historical Provider, MD  beclomethasone (QVAR) 40 MCG/ACT inhaler Inhale 2 puffs into the lungs 2 (two) times daily.  Historical Provider, MD  cyclobenzaprine (FLEXERIL) 5 MG tablet 1 tablet every 8 hours as needed for muscle spasms 11/06/15   Paulette Blanch, MD  doxepin (SINEQUAN) 50 MG capsule Take 50 mg by mouth at bedtime.  04/19/15   Historical Provider, MD  escitalopram (LEXAPRO) 10 MG tablet Take 10 mg by mouth daily. 05/02/15   Historical Provider, MD  HYDROcodone-acetaminophen (NORCO) 5-325 MG tablet Take 1-2 tablets by mouth every 6 (six) hours as needed for severe pain. 07/05/15   Jami L Hagler, PA-C  HYDROcodone-acetaminophen (NORCO) 5-325 MG tablet Take 2 tablets by mouth every 6 (six) hours as needed for moderate pain. 08/13/15   Merlyn Lot, MD  levalbuterol [facility] HFA) 45 MCG/ACT inhaler Inhale 2 puffs into the lungs every 4 (four) hours. 06/14/14 06/14/15  Historical Provider, MD  naproxen (NAPROSYN) 500 MG tablet Take 1 tablet (500 mg total) by mouth 2 (two) times daily with a meal. 11/06/15   Paulette Blanch, MD  omeprazole (PRILOSEC) 20 MG capsule Take 20 mg by mouth 2 (two) times daily before a meal.  03/02/15   Historical Provider, MD  oxyCODONE (OXY IR/ROXICODONE) 5 MG immediate release tablet Take 5 mg by mouth every 4 (four) hours as needed for moderate pain or severe pain. Reported on 05/26/2015 05/02/15   Historical Provider, MD  oxyCODONE-acetaminophen (ROXICET) 5-325 MG tablet Take 1 tablet by mouth every 6 (six) hours as needed. 07/16/15   Harvest Dark, MD  polyethylene glycol [facility] / Floria Raveling) packet Take 17 g by mouth daily as needed for mild constipation or moderate constipation.    Historical Provider, MD  promethazine (PHENERGAN) 12.5 MG tablet Take 1 tablet (12.5 mg total) by mouth every 6 (six) hours as needed for nausea or vomiting. 08/13/15   Merlyn Lot, MD  tamsulosin (FLOMAX) 0.4 MG CAPS capsule Take 1 capsule (0.4 mg total) by mouth daily. 08/27/15   Orbie Pyo, MD  ziprasidone (GEODON) 80 MG capsule Take 80 mg by mouth 2 (two) times daily with a meal.  05/08/15   Historical Provider, MD    Allergies Morphine  No family history on file.  Social History Social History  Substance Use Topics  . Smoking status: Current Every Day Smoker    Packs/day: 0.50    Types:  Cigarettes  . Smokeless tobacco: Never Used  . Alcohol use No    Review of Systems  Constitutional: No fever/chills. Eyes: No visual changes. ENT: No sore throat. Cardiovascular: Denies chest pain. Respiratory: Denies shortness of breath. Gastrointestinal: No abdominal pain.  No nausea, no vomiting.  No diarrhea.  No constipation. Genitourinary: Negative for dysuria. Musculoskeletal: Positive for neck pain. Positive for bilateral forearm abrasions. Negative for back pain. Skin: Negative for rash. Neurological: Negative for headaches, focal weakness or numbness.  10-point ROS otherwise negative.  ____________________________________________   PHYSICAL EXAM:  VITAL SIGNS: ED Triage Vitals  Enc Vitals Group     BP 11/05/15 2249 (!) 142/93     Pulse Rate 11/05/15 2248 83     Resp 11/05/15 2248 16     Temp 11/05/15 2248 98.5 F (36.9 C)     Temp Source 11/05/15 2248 Oral     SpO2 11/05/15 2248 96 %     Weight 11/05/15 2248 225 lb (102.1 kg)     Height 11/05/15 2248 5\' 10"  (1.778 m)     Head Circumference --      Peak Flow --      Pain Score 11/05/15 2248  8     Pain Loc --      Pain Edu? --      Excl. in [place]? --     Constitutional: Alert and oriented. Well appearing and in no acute distress. Eyes: Conjunctivae are normal. PERRL. EOMI. Head: Atraumatic. Nose: No congestion/rhinnorhea. Mouth/Throat: Mucous membranes are moist.  Oropharynx non-erythematous. Neck: No stridor.  No cervical spine tenderness to palpation.  No step-offs or deformities.  No carotid bruits.  Left SCM muscle spasms.  Painful to turn head towards the left against resistance. Cardiovascular: Normal rate, regular rhythm. Grossly normal heart sounds.  Good peripheral circulation. Respiratory: Normal respiratory effort.  No retractions. Lungs CTAB. Gastrointestinal: Soft and nontender. No distention. No abdominal bruits. No CVA tenderness. Musculoskeletal: Superficial abrasions to bilateral forearms  which are nonbleeding. No lower extremity tenderness nor edema.  No joint effusions. Neurologic:  Normal speech and language. No gross focal neurologic deficits are appreciated. 5/5 motor strength and sensation all extremities. No gait instability. Skin:  Skin is warm, dry and intact. No rash noted. Psychiatric: Mood and affect are normal. Speech and behavior are normal.  ____________________________________________   LABS (all labs ordered are listed, but only abnormal results are displayed)  Labs Reviewed - No data to display ____________________________________________  EKG  None ____________________________________________  RADIOLOGY  None ____________________________________________   PROCEDURES  Procedure(s) performed: None  Procedures  Critical Care performed: No  ____________________________________________   INITIAL IMPRESSION / ASSESSMENT AND PLAN / ED COURSE  Pertinent labs & imaging results that were available during my care of the patient were reviewed by me and considered in my medical decision making (see chart for details).  45 year old male who presents with left-sided torticollis and requesting tetanus for abrasions to bilateral forearms while working on a car. Will treat with NSAIDs, muscle relaxer and patient to follow-up with orthopedics as needed. Strict return precautions given. Patient verbalizes understanding and agrees with plan of care.  Clinical Course     ____________________________________________   FINAL CLINICAL IMPRESSION(S) / ED DIAGNOSES  Final diagnoses:  Torticollis, acute  Abrasion      NEW MEDICATIONS STARTED DURING THIS VISIT:  New Prescriptions   CYCLOBENZAPRINE (FLEXERIL) 5 MG TABLET    1 tablet every 8 hours as needed for muscle spasms   NAPROXEN (NAPROSYN) 500 MG TABLET    Take 1 tablet (500 mg total) by mouth 2 (two) times daily with a meal.     Note:  This document was prepared using Dragon voice  recognition software and may include unintentional dictation errors.    Paulette Blanch, MD 11/06/15 [phone]

## 2015-11-12 ENCOUNTER — Emergency Department
Admission: EM | Admit: 2015-11-12 | Discharge: 2015-11-12 | Disposition: A | Discharge status: Home / Self Care | Payer: Medicaid Other | Attending: Emergency Medicine | Admitting: Emergency Medicine

## 2015-11-12 ENCOUNTER — Encounter: Payer: Self-pay | Admitting: Emergency Medicine

## 2015-11-12 DIAGNOSIS — M436 Torticollis: Secondary | ICD-10-CM | POA: Diagnosis not present

## 2015-11-12 DIAGNOSIS — J449 Chronic obstructive pulmonary disease, unspecified: Secondary | ICD-10-CM | POA: Diagnosis not present

## 2015-11-12 DIAGNOSIS — F17210 Nicotine dependence, cigarettes, uncomplicated: Secondary | ICD-10-CM | POA: Insufficient documentation

## 2015-11-12 DIAGNOSIS — M5030 Other cervical disc degeneration, unspecified cervical region: Secondary | ICD-10-CM | POA: Insufficient documentation

## 2015-11-12 DIAGNOSIS — G8929 Other chronic pain: Secondary | ICD-10-CM | POA: Diagnosis not present

## 2015-11-12 DIAGNOSIS — I10 Essential (primary) hypertension: Secondary | ICD-10-CM | POA: Insufficient documentation

## 2015-11-12 DIAGNOSIS — M62838 Other muscle spasm: Secondary | ICD-10-CM

## 2015-11-12 DIAGNOSIS — Z791 Long term (current) use of non-steroidal anti-inflammatories (NSAID): Secondary | ICD-10-CM | POA: Insufficient documentation

## 2015-11-12 DIAGNOSIS — M542 Cervicalgia: Secondary | ICD-10-CM

## 2015-11-12 DIAGNOSIS — Z79899 Other long term (current) drug therapy: Secondary | ICD-10-CM | POA: Diagnosis not present

## 2015-11-12 MED ORDER — METHYLPREDNISOLONE SODIUM SUCC 125 MG IJ SOLR
80.0000 mg | Freq: Once | INTRAMUSCULAR | Status: AC
  Administered 2015-11-12: 80 mg via INTRAMUSCULAR
  Filled 2015-11-12: qty 2

## 2015-11-12 MED ORDER — DIAZEPAM 5 MG/ML IJ SOLN: 5.0000 mg | Freq: Once | INTRAMUSCULAR | Status: DC

## 2015-11-12 MED ORDER — ORPHENADRINE CITRATE 30 MG/ML IJ SOLN
INTRAMUSCULAR | Status: AC
  Administered 2015-11-12: 60 mg via INTRAMUSCULAR
  Filled 2015-11-12: qty 2

## 2015-11-12 MED ORDER — ORPHENADRINE CITRATE 30 MG/ML IJ SOLN
60.0000 mg | Freq: Two times a day (BID) | INTRAMUSCULAR | Status: DC
  Administered 2015-11-12: 60 mg via INTRAMUSCULAR

## 2015-11-12 MED ORDER — PREDNISONE 10 MG PO TABS: ORAL_TABLET | ORAL | 0 refills | Status: DC

## 2015-11-12 NOTE — ED Triage Notes (Signed)
States has degenerative neck problems, Pain increased x 5 days without new injury.

## 2015-11-12 NOTE — ED Provider Notes (Signed)
[facility] Emergency Department Provider Note  ____________________________________________  Time seen: Approximately 10:53 AM  I have reviewed the triage vital signs and the nursing notes.   HISTORY  Chief Complaint Neck Pain    HPI Derrick Maldonado is a 45 y.o. male , NAD, presents to the emergency department, accompanied by a family member, with five-day history of left-sided neck pain. Patient states he has chronic degenerative disc disease of the cervical spine which causes muscle spasm and decreased range of motion of the neck. States he has had continuing muscle pain about his left and right side of his neck with decreased range of motion over the last few days. He has tried multiple muscle relaxers without significant relief of pain and muscle spasms. Most recently he was placed on Tiazac and edema by his primary care provider which she states has not been helping. Denies any recent injuries, traumas or falls to exacerbate his pain. He is established with the [facility] pain clinic in which she has left a message to schedule an appointment for cortisol injections which have helped him in the past. Denies any headaches, visual changes, numbness, weakness, tingling. Denies any chest pain, shortness breath or abdominal pain. Noted any redness, swelling or skin sores.   Past Medical History:  Diagnosis Date  . Acid reflux 05/12/2012   Last Assessment & Plan:  Condition is unchanged Medication and treatment plan as described in orders. Patient education handout provided.   . Anxiety   . Astigmatism, regular 12/12/2010  . 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 Continue with post-operative care; reviewed infection precautions   . Benzodiazepine dependence ([place]) 10/16/2013  . Bipolar disorder ([place])   . Cannabis abuse 10/16/2013  .  Cervical pain 05/07/2013  . Cervical spondylosis without myelopathy 05/07/2013  . Chronic obstructive pulmonary disease ([name]) 12/19/2012   Last Assessment & Plan:  Condition is worse Differential includes URI Medication and treatment plan as described in orders or med list. Patient education handout provided.   . Chronic pain associated with significant psychosocial dysfunction 07/21/2014  . Clinical depression 05/26/2015  . Cocaine abuse 06/03/2013  . Episodic mood disorder ([name]) 10/10/2013  . Error, refractive, myopia 12/12/2010  . Hypertension   . Intentional drug overdose ([name]) 02/16/2014  . PTSD (post-traumatic stress disorder)   . Scar condition and fibrosis of skin 02/13/2005  . Schizophrenia [facility])     Patient Active Problem List   Diagnosis Date Noted  . Clinical depression 05/26/2015  . Chronic pain associated with significant psychosocial dysfunction 07/21/2014  . Intentional drug overdose ([place]) 02/16/2014  . Benzodiazepine dependence ([name]) 10/16/2013  . Cannabis abuse 10/16/2013  . 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 Episodic mood disorder ([name]) 10/10/2013  . Cocaine abuse 06/03/2013  . Cervical spondylosis without myelopathy 05/07/2013  . Cervical pain 05/07/2013  . Chronic obstructive pulmonary disease ([name]) 12/19/2012  . Current tobacco use 12/19/2012  . Acid reflux 05/12/2012  . 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 Error, refractive, myopia 12/12/2010  . Astigmatism, regular 12/12/2010  . Scar condition and fibrosis of skin 02/13/2005    Past Surgical History:  Procedure Laterality Date  . ARM DEBRIDEMENT    . HERNIA REPAIR    . TYMPANOPLASTY      Prior to Admission medications  Medication Sig Start Date End Date Taking? Authorizing Provider  amLODipine (NORVASC) 5 MG tablet Take 5 mg by mouth daily. 04/27/15   Historical Provider, MD  beclomethasone (QVAR) 40 MCG/ACT inhaler Inhale 2 puffs into the lungs 2  (two) times daily.    Historical Provider, MD  cyclobenzaprine (FLEXERIL) 5 MG tablet 1 tablet every 8 hours as needed for muscle spasms 11/06/15   Paulette Blanch, MD  doxepin (SINEQUAN) 50 MG capsule Take 50 mg by mouth at bedtime. 04/19/15   Historical Provider, MD  escitalopram (LEXAPRO) 10 MG tablet Take 10 mg by mouth daily. 05/02/15   Historical Provider, MD  HYDROcodone-acetaminophen (NORCO) 5-325 MG tablet Take 1-2 tablets by mouth every 6 (six) hours as needed for severe pain. 07/05/15   Izac Faulkenberry L Kutler Vanvranken, PA-C  HYDROcodone-acetaminophen (NORCO) 5-325 MG tablet Take 2 tablets by mouth every 6 (six) hours as needed for moderate pain. 08/13/15   Merlyn Lot, MD  levalbuterol [facility] HFA) 45 MCG/ACT inhaler Inhale 2 puffs into the lungs every 4 (four) hours. 06/14/14 06/14/15  Historical Provider, MD  naproxen (NAPROSYN) 500 MG tablet Take 1 tablet (500 mg total) by mouth 2 (two) times daily with a meal. 11/06/15   Paulette Blanch, MD  omeprazole (PRILOSEC) 20 MG capsule Take 20 mg by mouth 2 (two) times daily before a meal.  03/02/15   Historical Provider, MD  oxyCODONE (OXY IR/ROXICODONE) 5 MG immediate release tablet Take 5 mg by mouth every 4 (four) hours as needed for moderate pain or severe pain. Reported on 05/26/2015 05/02/15   Historical Provider, MD  oxyCODONE-acetaminophen (ROXICET) 5-325 MG tablet Take 1 tablet by mouth every 6 (six) hours as needed. 07/16/15   Harvest Dark, MD  polyethylene glycol [facility] Floria Raveling) packet Take 17 g by mouth daily as needed for mild constipation or moderate constipation.    Historical Provider, MD  predniSONE (DELTASONE) 10 MG tablet Take a daily regimen of 6,5,4,3,2,1 11/12/15   Steffi Noviello L Nayeli Calvert, PA-C  promethazine (PHENERGAN) 12.5 MG tablet Take 1 tablet (12.5 mg total) by mouth every 6 (six) hours as needed for nausea or vomiting. 08/13/15   Merlyn Lot, MD  tamsulosin (FLOMAX) 0.4 MG CAPS capsule Take 1 capsule (0.4 mg total) by mouth daily. 08/27/15   Orbie Pyo, MD  ziprasidone (GEODON) 80 MG capsule Take 80 mg by mouth 2 (two) times daily with a meal.  05/08/15   Historical Provider, MD    Allergies Morphine  No family history on file.  Social History Social History  Substance Use Topics  . Smoking status: Current Every Day Smoker    Packs/day: 0.50    Types: Cigarettes  . Smokeless tobacco: Never Used  . Alcohol use No     Review of Systems  Constitutional: No fatigue Eyes: No visual changes.  Cardiovascular: No chest pain. Respiratory:  No shortness of breath.  Musculoskeletal: Positive neck pain and muscle spasm. Negative for back pain.  Skin: Negative for rash, redness, swelling, skin sores. Neurological: Negative for headaches, focal weakness or numbness. No tingling. 10-point ROS otherwise negative.  ____________________________________________   PHYSICAL EXAM:  VITAL SIGNS: ED Triage Vitals  Enc Vitals Group     BP 11/12/15 1010 134/90     Pulse Rate 11/12/15 1010 80     Resp 11/12/15 1010 18     Temp 11/12/15 1010 97.9 F (36.6 C)     Temp Source 11/12/15 1010 Oral     SpO2 11/12/15 1010  98 %     Weight 11/12/15 1011 225 lb (102.1 kg)     Height 11/12/15 1011 5\' 10"  (1.778 m)     Head Circumference --      Peak Flow --      Pain Score 11/12/15 1011 10     Pain Loc --      Pain Edu? --      Excl. in [place]? --      Constitutional: Alert and oriented. Well appearing and in no acute distress. Eyes: Conjunctivae are normal without icterus or injection.  Head: Atraumatic. Neck: No stridor. No central cervical spine tenderness to palpation. Diffuse muscle spasm noted about the bilateral trapezius muscles. Decreased range of motion of the cervical spine with right and left lateral rotation as well as flexion due to pain and muscle spasm. Patient is able to flex and extend the cervical spine to approximately 20-30 but with pain. Hematological/Lymphatic/Immunilogical: No cervical  lymphadenopathy. Cardiovascular: Normal rate, regular rhythm. Normal S1 and S2.  Good peripheral circulation. Respiratory: Normal respiratory effort without tachypnea or retractions. Lungs CTAB with breath sounds noted in all lung fields. No wheeze, rhonchi, rales. Musculoskeletal: Full range of motion of bilateral upper extremities without pain or difficulty. Neurologic:  Normal speech and language. No gross focal neurologic deficits are appreciated. Grip strength in bilateral upper extremities is 5 out of 5 and equal. Sensation to light touch grossly intact bilateral upper extremities. Skin:  Skin is warm, dry and intact. No rash, redness, swelling, skin sores noted. Psychiatric: Mood and affect are normal. Speech and behavior are normal. Patient exhibits appropriate insight and judgement.   ____________________________________________   LABS  None ____________________________________________  EKG  None ____________________________________________  RADIOLOGY  None ____________________________________________    PROCEDURES  Procedure(s) performed: None   Procedures   Medications  orphenadrine (NORFLEX) injection 60 mg (60 mg Intramuscular Given 11/12/15 1132)  methylPREDNISolone sodium succinate (SOLU-MEDROL) 125 mg/2 mL injection 80 mg (80 mg Intramuscular Given 11/12/15 1130)     ____________________________________________   INITIAL IMPRESSION / ASSESSMENT AND PLAN / ED COURSE  Pertinent labs & imaging results that were available during my care of the patient were reviewed by me and considered in my medical decision making (see chart for details).  Clinical Course as of Nov 11 1209  Sat Nov 12, 2015  1128 Diazepam injectable is on national back order. Norflex is available and was ordered.  [JH]  1200 Patient states his pain has decreased as well as has noted decrease in muscle spasm. Will discharge patient home with prescriptions for oral prednisone and  follow-up with his primary care provider or his specialist at [facility] next week.  [JH]    Clinical Course User Index [JH] Moniqua Engebretsen L Fox Salminen, PA-C    Patient's diagnosis is consistent with Torticollis due to muscle spasms of neck and chronic neck pain. Patient will be discharged home with prescriptions for prednisone to take as directed. Patient is advised he may not taking any other over-the-counter NSAIDs while on the prednisone but could take Tylenol as needed for breakthrough pain. Patient may continue ties and again as previous prescribed by his primary care provider. Patient is to follow up with primary care provider as well as the [facility] pain management [facility] if symptoms persist past this treatment course. Patient is given ED precautions to return to the ED for any worsening or new symptoms.    ____________________________________________  FINAL CLINICAL IMPRESSION(S) / ED DIAGNOSES  Final diagnoses:  Torticollis  Muscle  spasms of neck  Chronic neck pain  DDD (degenerative disc disease), cervical      NEW MEDICATIONS STARTED DURING THIS VISIT:  Discharge Medication List as of 11/12/2015 12:00 PM    START taking these medications   Details  predniSONE (DELTASONE) 10 MG tablet Take a daily regimen of 6,5,4,3,2,1, Print             Braxton Feathers, PA-C 11/12/15 1212    Nance Pear, MD 11/12/15 1544

## 2016-03-27 ENCOUNTER — Emergency Department
Admission: EM | Admit: 2016-03-27 | Discharge: 2016-03-27 | Disposition: A | Discharge status: Home / Self Care | Payer: Medicaid Other | Attending: Emergency Medicine | Admitting: Emergency Medicine

## 2016-03-27 ENCOUNTER — Encounter: Payer: Self-pay | Admitting: *Deleted

## 2016-03-27 ENCOUNTER — Emergency Department: Payer: Medicaid Other

## 2016-03-27 DIAGNOSIS — B9789 Other viral agents as the cause of diseases classified elsewhere: Secondary | ICD-10-CM

## 2016-03-27 DIAGNOSIS — J441 Chronic obstructive pulmonary disease with (acute) exacerbation: Secondary | ICD-10-CM | POA: Diagnosis not present

## 2016-03-27 DIAGNOSIS — J069 Acute upper respiratory infection, unspecified: Secondary | ICD-10-CM | POA: Insufficient documentation

## 2016-03-27 DIAGNOSIS — F17210 Nicotine dependence, cigarettes, uncomplicated: Secondary | ICD-10-CM | POA: Diagnosis not present

## 2016-03-27 DIAGNOSIS — R05 Cough: Secondary | ICD-10-CM | POA: Diagnosis present

## 2016-03-27 DIAGNOSIS — I10 Essential (primary) hypertension: Secondary | ICD-10-CM | POA: Insufficient documentation

## 2016-03-27 MED ORDER — PREDNISONE 10 MG PO TABS: ORAL_TABLET | ORAL | 0 refills | Status: DC

## 2016-03-27 MED ORDER — IPRATROPIUM-ALBUTEROL 0.5-2.5 (3) MG/3ML IN SOLN
3.0000 mL | Freq: Once | RESPIRATORY_TRACT | Status: AC
  Administered 2016-03-27: 3 mL via RESPIRATORY_TRACT
  Filled 2016-03-27: qty 3

## 2016-03-27 NOTE — Discharge Instructions (Signed)
Continue using your inhaler at home. Begin taking prednisone as directed. Call today to make an appointment with your primary care doctor. Today your blood pressure was elevated at 161/100. Also consider discontinuing smoking or decrease.

## 2016-03-27 NOTE — ED Provider Notes (Signed)
[facility] Emergency Department Provider Note  ____________________________________________   First MD Initiated Contact with Patient 03/27/16 1059     (approximate)  I have reviewed the triage vital signs and the nursing notes.   HISTORY  Chief Complaint Cough and Nasal Congestion    HPI Derrick Maldonado is a 46 y.o. male is here with complaint of  Congestion and productive cough for one week. Patient states that he has had nasal congestion and a subjective fever. He has been taking over-the-counter medication for 7 days without relief.patient states he has a history of COPD and continues to use medication as prescribed by his physician. He also continues to smoke one pack cigarettes per day. He also has history of smoking marijuana.   Past Medical History:  Diagnosis Date  . Acid reflux 05/12/2012   Last Assessment & Plan:  Condition is unchanged Medication and treatment plan as described in orders. Patient education handout provided.   . Anxiety   . Astigmatism, regular 12/12/2010  . 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 Continue with post-operative care; reviewed infection precautions   . Benzodiazepine dependence ([place]) 10/16/2013  . Bipolar disorder ([place])   . Cannabis abuse 10/16/2013  . Cervical pain 05/07/2013  . Cervical spondylosis without myelopathy 05/07/2013  . Chronic obstructive pulmonary disease ([place]) 12/19/2012   Last Assessment & Plan:  Condition is worse Differential includes URI Medication and treatment plan as described in orders or med list. Patient education handout provided.   . Chronic pain associated with significant psychosocial dysfunction 07/21/2014  . Clinical depression 05/26/2015  . Cocaine abuse 06/03/2013  . Episodic mood disorder ([place]) 10/10/2013  . Error, refractive, myopia 12/12/2010   . Hypertension   . Intentional drug overdose ([place]) 02/16/2014  . PTSD (post-traumatic stress disorder)   . Scar condition and fibrosis of skin 02/13/2005  . Schizophrenia [facility])     Patient Active Problem List   Diagnosis Date Noted  . Clinical depression 05/26/2015  . Chronic pain associated with significant psychosocial dysfunction 07/21/2014  . Intentional drug overdose ([place]) 02/16/2014  . Benzodiazepine dependence ([place]) 10/16/2013  . Cannabis abuse 10/16/2013  . Drug-induced mood disorder ([place]) 10/16/2013  . Episodic mood disorder ([place]) 10/10/2013  . Cocaine abuse 06/03/2013  . Cervical spondylosis without myelopathy 05/07/2013  . Cervical pain 05/07/2013  . Chronic obstructive pulmonary disease ([place]) 12/19/2012  . Current tobacco use 12/19/2012  . Acid reflux 05/12/2012  . 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 Error, refractive, myopia 12/12/2010  . Astigmatism, regular 12/12/2010  . Scar condition and fibrosis of skin 02/13/2005    Past Surgical History:  Procedure Laterality Date  . ARM DEBRIDEMENT    . HERNIA REPAIR    . TYMPANOPLASTY      Prior to Admission medications   Medication Sig Start Date End Date Taking? Authorizing Provider  amLODipine (NORVASC) 5 MG tablet Take 5 mg by mouth daily. 04/27/15   Historical Provider, MD  beclomethasone (QVAR) 40 MCG/ACT inhaler Inhale 2 puffs into the lungs 2 (two) times daily.    Historical Provider, MD  doxepin (SINEQUAN) 50 MG capsule Take 50 mg by mouth at bedtime. 04/19/15   Historical Provider, MD  escitalopram (LEXAPRO) 10 MG tablet Take 10 mg by mouth daily. 05/02/15   Historical Provider,  MD  levalbuterol (XOPENEX HFA) 45 MCG/ACT inhaler Inhale 2 puffs into the lungs every 4 (four) hours. 06/14/14 06/14/15  Historical Provider, MD  omeprazole (PRILOSEC) 20 MG capsule Take 20 mg by mouth 2 (two) times daily before a meal.  03/02/15   Historical Provider, MD  oxyCODONE (OXY  IR/ROXICODONE) 5 MG immediate release tablet Take 5 mg by mouth every 4 (four) hours as needed for moderate pain or severe pain. Reported on 05/26/2015 05/02/15   Historical Provider, MD  polyethylene glycol (MIRALAX / GLYCOLAX) packet Take 17 g by mouth daily as needed for mild constipation or moderate constipation.    Historical Provider, MD  predniSONE (DELTASONE) 10 MG tablet Take 6 tablets  today, on day 2 take 5 tablets, day 3 take 4 tablets, day 4 take 3 tablets, day 5 take  2 tablets and 1 tablet the last day 03/27/16   Johnn Hai, PA-C  ziprasidone (GEODON) 80 MG capsule Take 80 mg by mouth 2 (two) times daily with a meal.  05/08/15   Historical Provider, MD    Allergies Morphine  History reviewed. No pertinent family history.  Social History Social History  Substance Use Topics  . Smoking status: Current Every Day Smoker    Packs/day: 0.50    Types: Cigarettes  . Smokeless tobacco: Never Used  . Alcohol use No    Review of Systems Constitutional: No fever/chills ENT: No sore throat. Cardiovascular: Denies chest pain. Respiratory: Denies shortness of breath.positive productive cough. Gastrointestinal: No abdominal pain.  No nausea, no vomiting.   Musculoskeletal: Negative for back pain. Skin: Negative for rash. Neurological: Negative for headaches, focal weakness or numbness.  10-point ROS otherwise negative.  ____________________________________________   PHYSICAL EXAM:  VITAL SIGNS: ED Triage Vitals [03/27/16 1007]  Enc Vitals Group     BP (!) 139/96     Pulse Rate 77     Resp 18     Temp 97.8 F (36.6 C)     Temp Source Oral     SpO2 96 %     Weight 230 lb (104.3 kg)     Height 5\' 10"  (1.778 m)     Head Circumference      Peak Flow      Pain Score      Pain Loc      Pain Edu?      Excl. in [place]?     Constitutional: Alert and oriented. Well appearing and in no acute distress. Eyes: Conjunctivae are normal. PERRL. EOMI. Head: Atraumatic. Nose: mild  congestion/rhinnorhea.   EACs and TMs are clear bilaterally. Mouth/Throat: Mucous membranes are moist.  Oropharynx non-erythematous. Neck: No stridor.   Hematological/Lymphatic/Immunilogical: No cervical lymphadenopathy. Cardiovascular: Normal rate, regular rhythm. Grossly normal heart sounds.  Good peripheral circulation. Respiratory: Normal respiratory effort.  No retractions. Lungs no wheezes are noted. Decreased breath sounds with congested cough. No rales or rhonchi noted. Gastrointestinal: Soft and nontender. No distention.  Musculoskeletal: his upper and lower extremities without difficulty. Normal gait was noted. Neurologic:  Normal speech and language. No gross focal neurologic deficits are appreciated. No gait instability. Skin:  Skin is warm, dry and intact. No rash noted. Psychiatric: Mood and affect are normal. Speech and behavior are normal.  ____________________________________________   LABS (all labs ordered are listed, but only abnormal results are displayed)  Labs Reviewed - No data to display   RADIOLOGY  x-ray per radiologist is negative for cardiopulmonary disease. I, Johnn Hai, personally viewed and evaluated  these images (plain radiographs) as part of my medical decision making, as well as reviewing the written report by the radiologist.  ____________________________________________   PROCEDURES  Procedure(s) performed: None  Procedures  Critical Care performed: No  ____________________________________________   INITIAL IMPRESSION / ASSESSMENT AND PLAN / ED COURSE  Pertinent labs & imaging results that were available during my care of the patient were reviewed by me and considered in my medical decision making (see chart for details).  Patient was made aware of his chest x-ray. He was given a DuoNeb treatment while in the emergency room. Patient improved somewhat. Patient continued to be ambulatory in the emergency room without any respiratory  difficulty. Patient was to continue using his inhaler at home. He was given a prescription for prednisone 60 mg 6 day taper. He also does call and make an appoint with his PCP for recheck of his blood pressure. Blood pressure today in the emergency room was elevated. Patient denies any previous history of hypertension but does have a family history. He was also encouraged to discontinue smoking.      ____________________________________________   FINAL CLINICAL IMPRESSION(S) / ED DIAGNOSES  Final diagnoses:  Viral URI with cough  COPD exacerbation ([place])      NEW MEDICATIONS STARTED DURING THIS VISIT:  Discharge Medication List as of 03/27/2016 11:56 AM       Note:  This document was prepared using Dragon voice recognition software and may include unintentional dictation errors.    Johnn Hai, PA-C 03/27/16 1454    Earleen Newport, MD 03/27/16 [phone]

## 2016-03-27 NOTE — ED Notes (Signed)
See triage note  States he developed cold sx's about 7 days ago  fever has been intermittent and cough has been occasionally prod  NAD noted at this time

## 2016-03-27 NOTE — ED Triage Notes (Signed)
States flu like symptoms for 1 week, nasal congestion and yellow productive fever

## 2016-07-20 ENCOUNTER — Ambulatory Visit
Admission: RE | Admit: 2016-07-20 | Discharge: 2016-07-20 | Disposition: A | Discharge status: Home / Self Care | Payer: MEDICAID

## 2016-07-20 DIAGNOSIS — J449 Chronic obstructive pulmonary disease, unspecified: Secondary | ICD-10-CM

## 2016-07-20 DIAGNOSIS — R55 Syncope and collapse: Secondary | ICD-10-CM

## 2016-07-20 DIAGNOSIS — J42 Unspecified chronic bronchitis: Principal | ICD-10-CM

## 2016-07-20 MED ORDER — TIOTROPIUM BROMIDE 18 MCG CAPSULE WITH INHALATION DEVICE
ORAL_CAPSULE | Freq: Every day | RESPIRATORY_TRACT | 2 refills | 0.00000 days | Status: CP
Start: 2016-07-20 — End: 2016-08-17

## 2016-08-07 MED ORDER — OMEPRAZOLE 20 MG CAPSULE,DELAYED RELEASE
ORAL_CAPSULE | Freq: Two times a day (BID) | ORAL | 3 refills | 0 days | Status: CP
Start: 2016-08-07 — End: 2016-12-06

## 2016-08-13 ENCOUNTER — Ambulatory Visit: Admission: RE | Admit: 2016-08-13 | Discharge: 2016-08-13 | Payer: MEDICAID

## 2016-08-13 DIAGNOSIS — J42 Unspecified chronic bronchitis: Secondary | ICD-10-CM

## 2016-08-13 DIAGNOSIS — R55 Syncope and collapse: Principal | ICD-10-CM

## 2016-08-14 ENCOUNTER — Ambulatory Visit
Admission: RE | Admit: 2016-08-14 | Discharge: 2016-08-14 | Disposition: A | Discharge status: Home / Self Care | Payer: MEDICAID

## 2016-08-14 DIAGNOSIS — J42 Unspecified chronic bronchitis: Principal | ICD-10-CM

## 2016-08-17 MED ORDER — UMECLIDINIUM 62.5 MCG-VILANTEROL 25 MCG/ACTUATION POWDR FOR INHALATION
Freq: Every day | RESPIRATORY_TRACT | 1 refills | 0.00000 days | Status: CP
Start: 2016-08-17 — End: 2016-10-08

## 2016-08-28 ENCOUNTER — Ambulatory Visit: Admission: RE | Admit: 2016-08-28 | Discharge: 2016-08-28 | Disposition: A | Discharge status: Home / Self Care

## 2016-08-28 DIAGNOSIS — M47812 Spondylosis without myelopathy or radiculopathy, cervical region: Principal | ICD-10-CM

## 2016-09-06 ENCOUNTER — Ambulatory Visit
Admission: RE | Admit: 2016-09-06 | Discharge: 2016-09-06 | Disposition: A | Discharge status: Home / Self Care | Payer: MEDICAID

## 2016-09-06 ENCOUNTER — Ambulatory Visit
Admission: RE | Admit: 2016-09-06 | Discharge: 2016-09-06 | Disposition: A | Discharge status: Home / Self Care | Payer: MEDICAID | Attending: Sports Medicine | Admitting: Sports Medicine

## 2016-09-06 DIAGNOSIS — I10 Essential (primary) hypertension: Secondary | ICD-10-CM

## 2016-09-06 DIAGNOSIS — R55 Syncope and collapse: Secondary | ICD-10-CM

## 2016-09-06 DIAGNOSIS — Z720 Tobacco use: Secondary | ICD-10-CM

## 2016-09-06 DIAGNOSIS — J42 Unspecified chronic bronchitis: Principal | ICD-10-CM

## 2016-09-13 ENCOUNTER — Ambulatory Visit
Admission: RE | Admit: 2016-09-13 | Discharge: 2016-09-13 | Disposition: A | Discharge status: Home / Self Care | Admitting: Anesthesiology

## 2016-09-13 ENCOUNTER — Ambulatory Visit
Admission: RE | Admit: 2016-09-13 | Discharge: 2016-09-13 | Disposition: A | Discharge status: Home / Self Care | Payer: MEDICAID

## 2016-09-13 DIAGNOSIS — M47812 Spondylosis without myelopathy or radiculopathy, cervical region: Principal | ICD-10-CM

## 2016-10-02 ENCOUNTER — Ambulatory Visit
Admission: RE | Admit: 2016-10-02 | Discharge: 2016-10-02 | Payer: MEDICAID | Attending: Sports Medicine | Admitting: Sports Medicine

## 2016-10-02 DIAGNOSIS — J42 Unspecified chronic bronchitis: Secondary | ICD-10-CM

## 2016-10-02 DIAGNOSIS — D1030 Benign neoplasm of unspecified part of mouth: Principal | ICD-10-CM

## 2016-10-02 DIAGNOSIS — N433 Hydrocele, unspecified: Secondary | ICD-10-CM

## 2016-10-02 DIAGNOSIS — N5082 Scrotal pain: Secondary | ICD-10-CM

## 2016-10-02 DIAGNOSIS — R55 Syncope and collapse: Secondary | ICD-10-CM

## 2016-10-02 DIAGNOSIS — Z720 Tobacco use: Secondary | ICD-10-CM

## 2016-10-02 MED ORDER — HYDROCORTISONE 2.5 % LOTION
1 refills | 0 days | Status: CP
Start: 2016-10-02 — End: 2017-01-03

## 2016-10-02 MED ORDER — HYDROXYZINE HCL 10 MG TABLET
ORAL_TABLET | Freq: Three times a day (TID) | ORAL | 1 refills | 0 days | Status: CP | PRN
Start: 2016-10-02 — End: 2017-01-03

## 2016-10-03 ENCOUNTER — Ambulatory Visit
Admission: RE | Admit: 2016-10-03 | Discharge: 2016-10-03 | Disposition: A | Discharge status: Home / Self Care | Payer: MEDICAID

## 2016-10-03 DIAGNOSIS — R55 Syncope and collapse: Principal | ICD-10-CM

## 2016-10-11 MED ORDER — ANORO ELLIPTA 62.5 MCG-25 MCG/ACTUATION POWDER FOR INHALATION
0 refills | 0 days | Status: CP
Start: 2016-10-11 — End: 2016-11-29

## 2016-10-19 ENCOUNTER — Ambulatory Visit: Admission: RE | Admit: 2016-10-19 | Discharge: 2016-10-19 | Payer: MEDICAID | Attending: Otolaryngology

## 2016-10-19 DIAGNOSIS — D1030 Benign neoplasm of unspecified part of mouth: Principal | ICD-10-CM

## 2016-11-29 ENCOUNTER — Ambulatory Visit: Admission: RE | Admit: 2016-11-29 | Discharge: 2016-11-29 | Payer: MEDICAID | Attending: Urology | Admitting: Urology

## 2016-11-29 DIAGNOSIS — N433 Hydrocele, unspecified: Secondary | ICD-10-CM

## 2016-11-29 DIAGNOSIS — N4340 Spermatocele of epididymis, unspecified: Secondary | ICD-10-CM

## 2016-11-29 DIAGNOSIS — N5082 Scrotal pain: Principal | ICD-10-CM

## 2016-11-29 DIAGNOSIS — R339 Retention of urine, unspecified: Secondary | ICD-10-CM

## 2016-11-29 DIAGNOSIS — N200 Calculus of kidney: Secondary | ICD-10-CM

## 2016-12-06 MED ORDER — OMEPRAZOLE 20 MG CAPSULE,DELAYED RELEASE
ORAL_CAPSULE | Freq: Two times a day (BID) | ORAL | 3 refills | 0.00000 days | Status: CP
Start: 2016-12-06 — End: 2017-04-11

## 2017-01-03 ENCOUNTER — Ambulatory Visit: Admit: 2017-01-03 | Discharge: 2017-01-04 | Payer: MEDICAID | Attending: Sports Medicine | Primary: Sports Medicine

## 2017-01-03 DIAGNOSIS — I10 Essential (primary) hypertension: Principal | ICD-10-CM

## 2017-01-03 DIAGNOSIS — F39 Unspecified mood [affective] disorder: Secondary | ICD-10-CM

## 2017-01-03 DIAGNOSIS — J42 Unspecified chronic bronchitis: Secondary | ICD-10-CM

## 2017-01-03 DIAGNOSIS — Z720 Tobacco use: Secondary | ICD-10-CM

## 2017-01-03 MED ORDER — HYDROCORTISONE 2.5 % LOTION
1 refills | 0 days | Status: SS
Start: 2017-01-03 — End: 2017-06-07

## 2017-01-03 MED ORDER — HYDROXYZINE HCL 25 MG TABLET
ORAL_TABLET | Freq: Three times a day (TID) | ORAL | 1 refills | 0 days | Status: CP | PRN
Start: 2017-01-03 — End: 2017-03-18

## 2017-01-03 MED ORDER — UMECLIDINIUM 62.5 MCG-VILANTEROL 25 MCG/ACTUATION POWDR FOR INHALATION
Freq: Every day | RESPIRATORY_TRACT | 3 refills | 0.00000 days | Status: CP
Start: 2017-01-03 — End: 2017-01-29

## 2017-01-24 ENCOUNTER — Ambulatory Visit: Admit: 2017-01-24 | Discharge: 2017-01-24 | Disposition: A | Discharge status: Home / Self Care | Payer: MEDICAID

## 2017-01-24 ENCOUNTER — Emergency Department: Admit: 2017-01-24 | Discharge: 2017-01-24 | Disposition: A | Discharge status: Home / Self Care | Payer: MEDICAID

## 2017-01-24 DIAGNOSIS — R079 Chest pain, unspecified: Principal | ICD-10-CM

## 2017-01-28 ENCOUNTER — Ambulatory Visit: Admit: 2017-01-28 | Discharge: 2017-01-29 | Payer: MEDICAID | Attending: Sports Medicine | Primary: Sports Medicine

## 2017-01-28 DIAGNOSIS — I10 Essential (primary) hypertension: Secondary | ICD-10-CM

## 2017-01-28 DIAGNOSIS — K219 Gastro-esophageal reflux disease without esophagitis: Secondary | ICD-10-CM

## 2017-01-28 DIAGNOSIS — Z720 Tobacco use: Secondary | ICD-10-CM

## 2017-01-28 DIAGNOSIS — J42 Unspecified chronic bronchitis: Principal | ICD-10-CM

## 2017-01-28 MED ORDER — HYDROCHLOROTHIAZIDE 25 MG TABLET
ORAL_TABLET | Freq: Every morning | ORAL | 11 refills | 0 days | Status: CP
Start: 2017-01-28 — End: 2017-02-14

## 2017-01-28 MED ORDER — FLUTICASONE 100 MCG-SALMETEROL 50 MCG/DOSE BLISTR POWDR FOR INHALATION
Freq: Two times a day (BID) | RESPIRATORY_TRACT | 3 refills | 0 days | Status: CP
Start: 2017-01-28 — End: 2017-02-14

## 2017-01-31 ENCOUNTER — Ambulatory Visit
Admit: 2017-01-31 | Discharge: 2017-01-31 | Disposition: A | Discharge status: Home / Self Care | Payer: MEDICAID | Attending: Emergency Medicine

## 2017-01-31 MED ORDER — PROMETHAZINE 25 MG TABLET
ORAL_TABLET | Freq: Four times a day (QID) | ORAL | 0 refills | 0 days | Status: CP | PRN
Start: 2017-01-31 — End: 2017-02-03

## 2017-02-14 ENCOUNTER — Ambulatory Visit: Admit: 2017-02-14 | Discharge: 2017-02-15 | Payer: MEDICAID | Attending: Sports Medicine | Primary: Sports Medicine

## 2017-02-14 DIAGNOSIS — Z720 Tobacco use: Secondary | ICD-10-CM

## 2017-02-14 DIAGNOSIS — J449 Chronic obstructive pulmonary disease, unspecified: Principal | ICD-10-CM

## 2017-02-14 DIAGNOSIS — I10 Essential (primary) hypertension: Secondary | ICD-10-CM

## 2017-02-14 MED ORDER — ALBUTEROL SULFATE 2.5 MG/3 ML (0.083 %) SOLUTION FOR NEBULIZATION
RESPIRATORY_TRACT | 2 refills | 0 days | Status: CP | PRN
Start: 2017-02-14 — End: 2018-07-08

## 2017-02-14 MED ORDER — HYDROCHLOROTHIAZIDE 25 MG TABLET
ORAL_TABLET | Freq: Every morning | ORAL | 1 refills | 0 days | Status: CP
Start: 2017-02-14 — End: 2017-05-15

## 2017-03-01 ENCOUNTER — Other Ambulatory Visit: Payer: Self-pay

## 2017-03-01 ENCOUNTER — Encounter: Payer: Self-pay | Admitting: Emergency Medicine

## 2017-03-01 ENCOUNTER — Emergency Department
Admission: EM | Admit: 2017-03-01 | Discharge: 2017-03-01 | Disposition: A | Discharge status: Home / Self Care | Payer: Medicaid Other | Attending: Emergency Medicine | Admitting: Emergency Medicine

## 2017-03-01 DIAGNOSIS — Y92410 Unspecified street and highway as the place of occurrence of the external cause: Secondary | ICD-10-CM | POA: Insufficient documentation

## 2017-03-01 DIAGNOSIS — Z79899 Other long term (current) drug therapy: Secondary | ICD-10-CM | POA: Insufficient documentation

## 2017-03-01 DIAGNOSIS — S39012A Strain of muscle, fascia and tendon of lower back, initial encounter: Secondary | ICD-10-CM | POA: Diagnosis not present

## 2017-03-01 DIAGNOSIS — Y9389 Activity, other specified: Secondary | ICD-10-CM | POA: Insufficient documentation

## 2017-03-01 DIAGNOSIS — S3992XA Unspecified injury of lower back, initial encounter: Secondary | ICD-10-CM | POA: Diagnosis present

## 2017-03-01 DIAGNOSIS — F17210 Nicotine dependence, cigarettes, uncomplicated: Secondary | ICD-10-CM | POA: Diagnosis not present

## 2017-03-01 DIAGNOSIS — I10 Essential (primary) hypertension: Secondary | ICD-10-CM | POA: Diagnosis not present

## 2017-03-01 DIAGNOSIS — Y999 Unspecified external cause status: Secondary | ICD-10-CM | POA: Insufficient documentation

## 2017-03-01 MED ORDER — NAPROXEN 500 MG PO TABS: 500.0000 mg | ORAL_TABLET | Freq: Two times a day (BID) | ORAL | 0 refills | Status: DC

## 2017-03-01 MED ORDER — CYCLOBENZAPRINE HCL 10 MG PO TABS: 10.0000 mg | ORAL_TABLET | Freq: Three times a day (TID) | ORAL | 0 refills | Status: DC | PRN
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
## 2017-03-18 ENCOUNTER — Ambulatory Visit: Admit: 2017-03-18 | Discharge: 2017-03-19 | Payer: MEDICAID | Attending: Sports Medicine | Primary: Sports Medicine

## 2017-03-18 DIAGNOSIS — I10 Essential (primary) hypertension: Principal | ICD-10-CM

## 2017-03-18 DIAGNOSIS — Z720 Tobacco use: Secondary | ICD-10-CM

## 2017-03-18 DIAGNOSIS — J42 Unspecified chronic bronchitis: Secondary | ICD-10-CM

## 2017-03-18 DIAGNOSIS — N5082 Scrotal pain: Secondary | ICD-10-CM

## 2017-03-18 MED ORDER — TRAMADOL 50 MG TABLET
ORAL_TABLET | Freq: Four times a day (QID) | ORAL | 0 refills | 0 days | Status: SS | PRN
Start: 2017-03-18 — End: 2017-06-05

## 2017-03-28 ENCOUNTER — Ambulatory Visit (INDEPENDENT_AMBULATORY_CARE_PROVIDER_SITE_OTHER): Payer: Medicaid Other | Admitting: Urology

## 2017-03-28 ENCOUNTER — Encounter: Payer: Self-pay | Admitting: Urology

## 2017-03-28 VITALS — BP 139/88 | HR 77 | Wt 211.9 lb

## 2017-03-28 DIAGNOSIS — N5082 Scrotal pain: Secondary | ICD-10-CM

## 2017-03-28 LAB — URINALYSIS, COMPLETE
Bilirubin, UA: NEGATIVE
GLUCOSE, UA: NEGATIVE
Leukocytes, UA: NEGATIVE
Nitrite, UA: NEGATIVE
RBC, UA: NEGATIVE
Specific Gravity, UA: 1.015 (ref 1.005–1.030)
UUROB: 1 mg/dL (ref 0.2–1.0)
pH, UA: 7 (ref 5.0–7.5)

## 2017-03-28 LAB — MICROSCOPIC EXAMINATION

## 2017-03-28 NOTE — Progress Notes (Signed)
03/28/2017 2:46 PM   Derrick Maldonado 1970-06-26 [id number]  Referring provider: Gennaro Africa, MD [id number] [facility] [place], [place] [address]  Chief Complaint  Patient presents with  . Testicle Pain    HPI: The patient is a 47 year old gentleman with a past medical history of bilateral inguinal herniorrhaphy who presents today for chronic right scrotal pain for the last 2 years. He was seen for pain in his right testicles in May 2017.  Scrotal ultrasound at that time was positive for right hydrocele.  He has since seen 2 other urologists with no relief of his pain.  He returns today with this 2-year history of what he describes a thumping sensation in his right testicle.  He believes it was present for his hernia repair however he is not sure.  It did start around the same time.  Nothing makes the pain better.  Nothing makes it worse.  No dysuria.  No current swelling.  His biggest concern is that he has been to multiple doctors and still cannot figure out why he is in pain.   PMH: Past Medical History:  Diagnosis Date  . Acid reflux 05/12/2012   Last Assessment & Plan:  Condition is unchanged Medication and treatment plan as described in orders. Patient education handout provided.   . Anxiety   . Astigmatism, regular 12/12/2010  . 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 Continue with post-operative care; reviewed infection precautions   . Benzodiazepine dependence ([place]) 10/16/2013  . Bipolar disorder ([place])   . Cannabis abuse 10/16/2013  . Cervical pain 05/07/2013  . Cervical spondylosis without myelopathy 05/07/2013  . Chronic obstructive pulmonary disease ([place]) 12/19/2012   Last Assessment & Plan:  Condition is worse Differential includes URI Medication and treatment plan as described in orders or med list.  Patient education handout provided.   . Chronic pain associated with significant psychosocial dysfunction 07/21/2014  . Clinical depression 05/26/2015  . Cocaine abuse ([place]) 06/03/2013  . Episodic mood disorder ([place]) 10/10/2013  . Error, refractive, myopia 12/12/2010  . Hypertension   . Intentional drug overdose ([place]) 02/16/2014  . PTSD (post-traumatic stress disorder)   . Scar condition and fibrosis of skin 02/13/2005  . Schizophrenia [facility])     Surgical History: Past Surgical History:  Procedure Laterality Date  . ARM DEBRIDEMENT    . HERNIA REPAIR    . TYMPANOPLASTY      Home Medications:  Allergies as of 03/28/2017      Reactions   Morphine Hives, Swelling   Gabapentin    Other reaction(s): Other (See Comments) Feels like he is drunk      Medication List        Accurate as of 03/28/17  2:46 PM. Always use your most recent med list.          albuterol (2.5 MG/3ML) 0.083% nebulizer solution Commonly known as:  PROVENTIL Take 2.5 mg by nebulization every 6 (six) hours as needed for wheezing or shortness of breath.   doxepin 50 MG capsule Commonly known as:  SINEQUAN Take 50 mg by mouth at bedtime.   escitalopram 10 MG tablet Commonly known as:  LEXAPRO Take 10 mg by mouth daily.   hydrochlorothiazide 12.5 MG capsule Commonly known as:  MICROZIDE Take 12.5 mg by mouth daily.   levalbuterol 45  MCG/ACT inhaler Commonly known as:  XOPENEX HFA Inhale 2 puffs into the lungs every 4 (four) hours.   omeprazole 20 MG capsule Commonly known as:  PRILOSEC Take 20 mg by mouth 2 (two) times daily before a meal.   ziprasidone 80 MG capsule Commonly known as:  GEODON Take 80 mg by mouth 2 (two) times daily with a meal.       Allergies:  Allergies  Allergen Reactions  . Morphine Hives and Swelling  . Gabapentin     Other reaction(s): Other (See Comments) Feels like he is drunk    Family History: History reviewed. No pertinent family history.  Social History:   reports that he has been smoking cigarettes.  He has been smoking about 0.50 packs per day. He has never used smokeless tobacco. He reports that he does not drink alcohol or use drugs.  ROS: UROLOGY Frequent Urination?: Yes Hard to postpone urination?: Yes Burning/pain with urination?: No Get up at night to urinate?: Yes Leakage of urine?: Yes Urine stream starts and stops?: No Trouble starting stream?: No Do you have to strain to urinate?: No Blood in urine?: Yes Urinary tract infection?: No Sexually transmitted disease?: No Injury to kidneys or bladder?: No Painful intercourse?: No Weak stream?: No Erection problems?: Yes Penile pain?: No  Gastrointestinal Nausea?: No Vomiting?: No Indigestion/heartburn?: No Diarrhea?: No Constipation?: No  Constitutional Fever: No Night sweats?: No Weight loss?: No Fatigue?: No  Skin Skin rash/lesions?: No Itching?: Yes  Eyes Blurred vision?: No Double vision?: No  Ears/Nose/Throat Sore throat?: No Sinus problems?: No  Hematologic/Lymphatic Swollen glands?: No Easy bruising?: No  Cardiovascular Leg swelling?: No Chest pain?: No  Respiratory Cough?: Yes Shortness of breath?: Yes  Endocrine Excessive thirst?: No  Musculoskeletal Back pain?: No Joint pain?: No  Neurological Headaches?: No Dizziness?: No  Psychologic Depression?: Yes Anxiety?: No  Physical Exam: BP 139/88 (BP Location: Right Arm, Patient Position: Sitting, Cuff Size: Normal)   Pulse 77   Wt 211 lb 14.4 oz (96.1 kg)   BMI 30.40 kg/m   Constitutional:  Alert and oriented, No acute distress. HEENT: [place] AT, moist mucus membranes.  Trachea midline, no masses. Cardiovascular: No clubbing, cyanosis, or edema. Respiratory: Normal respiratory effort, no increased work of breathing. GI: Abdomen is soft, nontender, nondistended, no abdominal masses GU: No CVA tenderness.  Normal phallus.  Both testicles descended equal bilaterally benign.  Though  the patient reports pain he did not seem to show any appreciable signs of tenderness on exam.  No sign of infection. Skin: No rashes, bruises or suspicious lesions. Lymph: No cervical or inguinal adenopathy. Neurologic: Grossly intact, no focal deficits, moving all 4 extremities. Psychiatric: Normal mood and affect.  Laboratory Data: Lab Results  Component Value Date   WBC 9.2 08/27/2015   HGB 15.8 08/27/2015   HCT 46.0 08/27/2015   MCV 92.2 08/27/2015   PLT 175 08/27/2015    Lab Results  Component Value Date   CREATININE 1.18 08/27/2015    No results found for: PSA  No results found for: TESTOSTERONE  No results found for: HGBA1C  Urinalysis    Component Value Date/Time   COLORURINE YELLOW (A) 08/27/2015 1041   APPEARANCEUR CLEAR (A) 08/27/2015 1041   LABSPEC 1.017 08/27/2015 1041   PHURINE 5.0 08/27/2015 1041   [place] 08/27/2015 [place] 08/27/2015 [place] 08/27/2015 [place] 08/27/2015 [place] 08/27/2015 1041   NITRITE NEGATIVE  08/27/2015 [place] 08/27/2015 1041    Assessment & Plan:    1.  Right scrotal pain I discussed the patient that he has chronic right scrotal pain.  We did discuss is a very difficult problem to treat.  It may be related to his inguinal hernia surgery though the patient thinks the pain may have been present prior to this surgery as that is the reason he underwent the surgery in the first place.  For now, I did discuss that there are many good options for this chronic pain.  I have arranged for him to see our pelvic floor physical therapist to see if they can have some success in helping him relieve this discomfort.  Return for refer to PT. f/u with [place] prn.  Nickie Retort, MD  [facility] [address], [place], [place] [address] [phone]

## 2017-04-12 MED ORDER — OMEPRAZOLE 20 MG CAPSULE,DELAYED RELEASE
ORAL_CAPSULE | Freq: Two times a day (BID) | ORAL | 5 refills | 0.00000 days | Status: CP
Start: 2017-04-12 — End: 2017-10-28

## 2017-04-16 ENCOUNTER — Ambulatory Visit: Admit: 2017-04-16 | Discharge: 2017-04-16 | Payer: MEDICAID

## 2017-04-16 DIAGNOSIS — J449 Chronic obstructive pulmonary disease, unspecified: Principal | ICD-10-CM

## 2017-04-16 DIAGNOSIS — Z720 Tobacco use: Secondary | ICD-10-CM

## 2017-04-16 DIAGNOSIS — R0609 Other forms of dyspnea: Secondary | ICD-10-CM

## 2017-04-16 MED ORDER — BUDESONIDE 0.5 MG/2 ML SUSPENSION FOR NEBULIZATION
Freq: Two times a day (BID) | RESPIRATORY_TRACT | 11 refills | 0.00000 days | Status: CP
Start: 2017-04-16 — End: 2018-04-16

## 2017-04-16 MED ORDER — GLYCOPYRROLATE 25 MCG/ML SOLUTION FOR NEBULIZATION-NEBULIZER,ACCESSOR.
Freq: Two times a day (BID) | RESPIRATORY_TRACT | 5 refills | 0 days | Status: CP
Start: 2017-04-16 — End: 2017-05-16

## 2017-04-30 ENCOUNTER — Ambulatory Visit: Admit: 2017-04-30 | Discharge: 2017-05-01 | Payer: MEDICAID

## 2017-04-30 DIAGNOSIS — M47812 Spondylosis without myelopathy or radiculopathy, cervical region: Principal | ICD-10-CM

## 2017-05-15 ENCOUNTER — Ambulatory Visit: Admit: 2017-05-15 | Discharge: 2017-05-16 | Payer: MEDICAID | Attending: Sports Medicine | Primary: Sports Medicine

## 2017-05-15 DIAGNOSIS — R55 Syncope and collapse: Secondary | ICD-10-CM

## 2017-05-15 DIAGNOSIS — Z720 Tobacco use: Secondary | ICD-10-CM

## 2017-05-15 DIAGNOSIS — J42 Unspecified chronic bronchitis: Principal | ICD-10-CM

## 2017-05-15 DIAGNOSIS — I10 Essential (primary) hypertension: Secondary | ICD-10-CM

## 2017-05-15 DIAGNOSIS — R200 Anesthesia of skin: Secondary | ICD-10-CM

## 2017-05-15 DIAGNOSIS — F39 Unspecified mood [affective] disorder: Secondary | ICD-10-CM

## 2017-05-16 MED ORDER — GLYCOPYRROLATE 25 MCG/ML SOLUTION FOR NEBULIZATION-NEBULIZER,ACCESSOR.
Freq: Two times a day (BID) | RESPIRATORY_TRACT | 5 refills | 0 days | Status: CP
Start: 2017-05-16 — End: 2017-06-10

## 2017-05-22 ENCOUNTER — Ambulatory Visit: Admit: 2017-05-22 | Discharge: 2017-05-23 | Payer: MEDICAID

## 2017-05-22 DIAGNOSIS — R200 Anesthesia of skin: Principal | ICD-10-CM

## 2017-05-29 ENCOUNTER — Ambulatory Visit: Admit: 2017-05-29 | Discharge: 2017-05-29 | Payer: MEDICAID

## 2017-05-29 DIAGNOSIS — M47812 Spondylosis without myelopathy or radiculopathy, cervical region: Principal | ICD-10-CM

## 2017-06-04 ENCOUNTER — Ambulatory Visit: Admit: 2017-06-04 | Discharge: 2017-06-10 | Disposition: A | Discharge status: Home / Self Care | Payer: MEDICAID

## 2017-06-04 DIAGNOSIS — F1410 Cocaine abuse, uncomplicated: Principal | ICD-10-CM

## 2017-06-10 MED ORDER — ESCITALOPRAM 10 MG TABLET
ORAL_TABLET | Freq: Every day | ORAL | 0 refills | 0.00000 days | Status: SS
Start: 2017-06-10 — End: 2017-12-13

## 2017-06-10 MED ORDER — DOXEPIN 50 MG CAPSULE
ORAL_CAPSULE | Freq: Every evening | ORAL | 0 refills | 0.00000 days
Start: 2017-06-10 — End: 2017-07-10

## 2017-06-10 MED ORDER — ZIPRASIDONE 80 MG CAPSULE
ORAL_CAPSULE | Freq: Two times a day (BID) | ORAL | 0 refills | 0 days | Status: SS
Start: 2017-06-10 — End: 2017-12-13

## 2017-06-11 MED ORDER — NICOTINE 21 MG/24 HR DAILY TRANSDERMAL PATCH
MEDICATED_PATCH | Freq: Every day | TRANSDERMAL | 0 refills | 0 days | Status: CP
Start: 2017-06-11 — End: 2018-01-10

## 2017-06-12 ENCOUNTER — Ambulatory Visit: Admit: 2017-06-12 | Discharge: 2017-06-13 | Payer: MEDICAID | Attending: Sports Medicine | Primary: Sports Medicine

## 2017-06-12 DIAGNOSIS — R200 Anesthesia of skin: Secondary | ICD-10-CM

## 2017-06-12 DIAGNOSIS — J42 Unspecified chronic bronchitis: Secondary | ICD-10-CM

## 2017-06-12 DIAGNOSIS — I10 Essential (primary) hypertension: Secondary | ICD-10-CM

## 2017-06-12 DIAGNOSIS — F1911 Other psychoactive substance abuse, in remission: Principal | ICD-10-CM

## 2017-06-13 ENCOUNTER — Ambulatory Visit: Admit: 2017-06-13 | Discharge: 2017-06-13 | Payer: MEDICAID

## 2017-06-13 DIAGNOSIS — M47812 Spondylosis without myelopathy or radiculopathy, cervical region: Principal | ICD-10-CM

## 2017-08-03 ENCOUNTER — Emergency Department
Admit: 2017-08-03 | Discharge: 2017-08-03 | Disposition: A | Discharge status: Home / Self Care | Payer: MEDICAID | Attending: Family

## 2017-08-03 ENCOUNTER — Ambulatory Visit
Admit: 2017-08-03 | Discharge: 2017-08-03 | Disposition: A | Discharge status: Home / Self Care | Payer: MEDICAID | Attending: Family

## 2017-08-03 DIAGNOSIS — S39012A Strain of muscle, fascia and tendon of lower back, initial encounter: Principal | ICD-10-CM

## 2017-08-03 MED ORDER — CYCLOBENZAPRINE 10 MG TABLET
ORAL_TABLET | Freq: Two times a day (BID) | ORAL | 0 refills | 0 days | Status: CP | PRN
Start: 2017-08-03 — End: 2017-12-12

## 2017-08-03 MED ORDER — NAPROXEN 500 MG TABLET
ORAL_TABLET | Freq: Two times a day (BID) | ORAL | 0 refills | 0 days | Status: CP | PRN
Start: 2017-08-03 — End: 2017-08-22

## 2017-08-22 ENCOUNTER — Ambulatory Visit: Admit: 2017-08-22 | Discharge: 2017-08-23 | Payer: MEDICAID | Attending: Sports Medicine | Primary: Sports Medicine

## 2017-08-22 DIAGNOSIS — M545 Low back pain: Principal | ICD-10-CM

## 2017-08-22 MED ORDER — NAPROXEN 500 MG TABLET
ORAL_TABLET | Freq: Two times a day (BID) | ORAL | 0 refills | 0.00000 days | Status: CP | PRN
Start: 2017-08-22 — End: 2017-08-29

## 2017-08-22 MED ORDER — METHOCARBAMOL 500 MG TABLET
ORAL_TABLET | Freq: Four times a day (QID) | ORAL | 0 refills | 0 days | Status: CP
Start: 2017-08-22 — End: 2018-01-10

## 2017-09-07 IMAGING — CR DG CHEST 2V
1 series · 2 of 2 positions shown · non-contrast
Comparison: 06/29/2013

CLINICAL DATA: Shortness of breath and productive cough.

EXAM:
CHEST  2 VIEW

[Series 1: dg chest 2 view · 0.14mm/px · 2 of 2 slices shown]
[im 1/2]
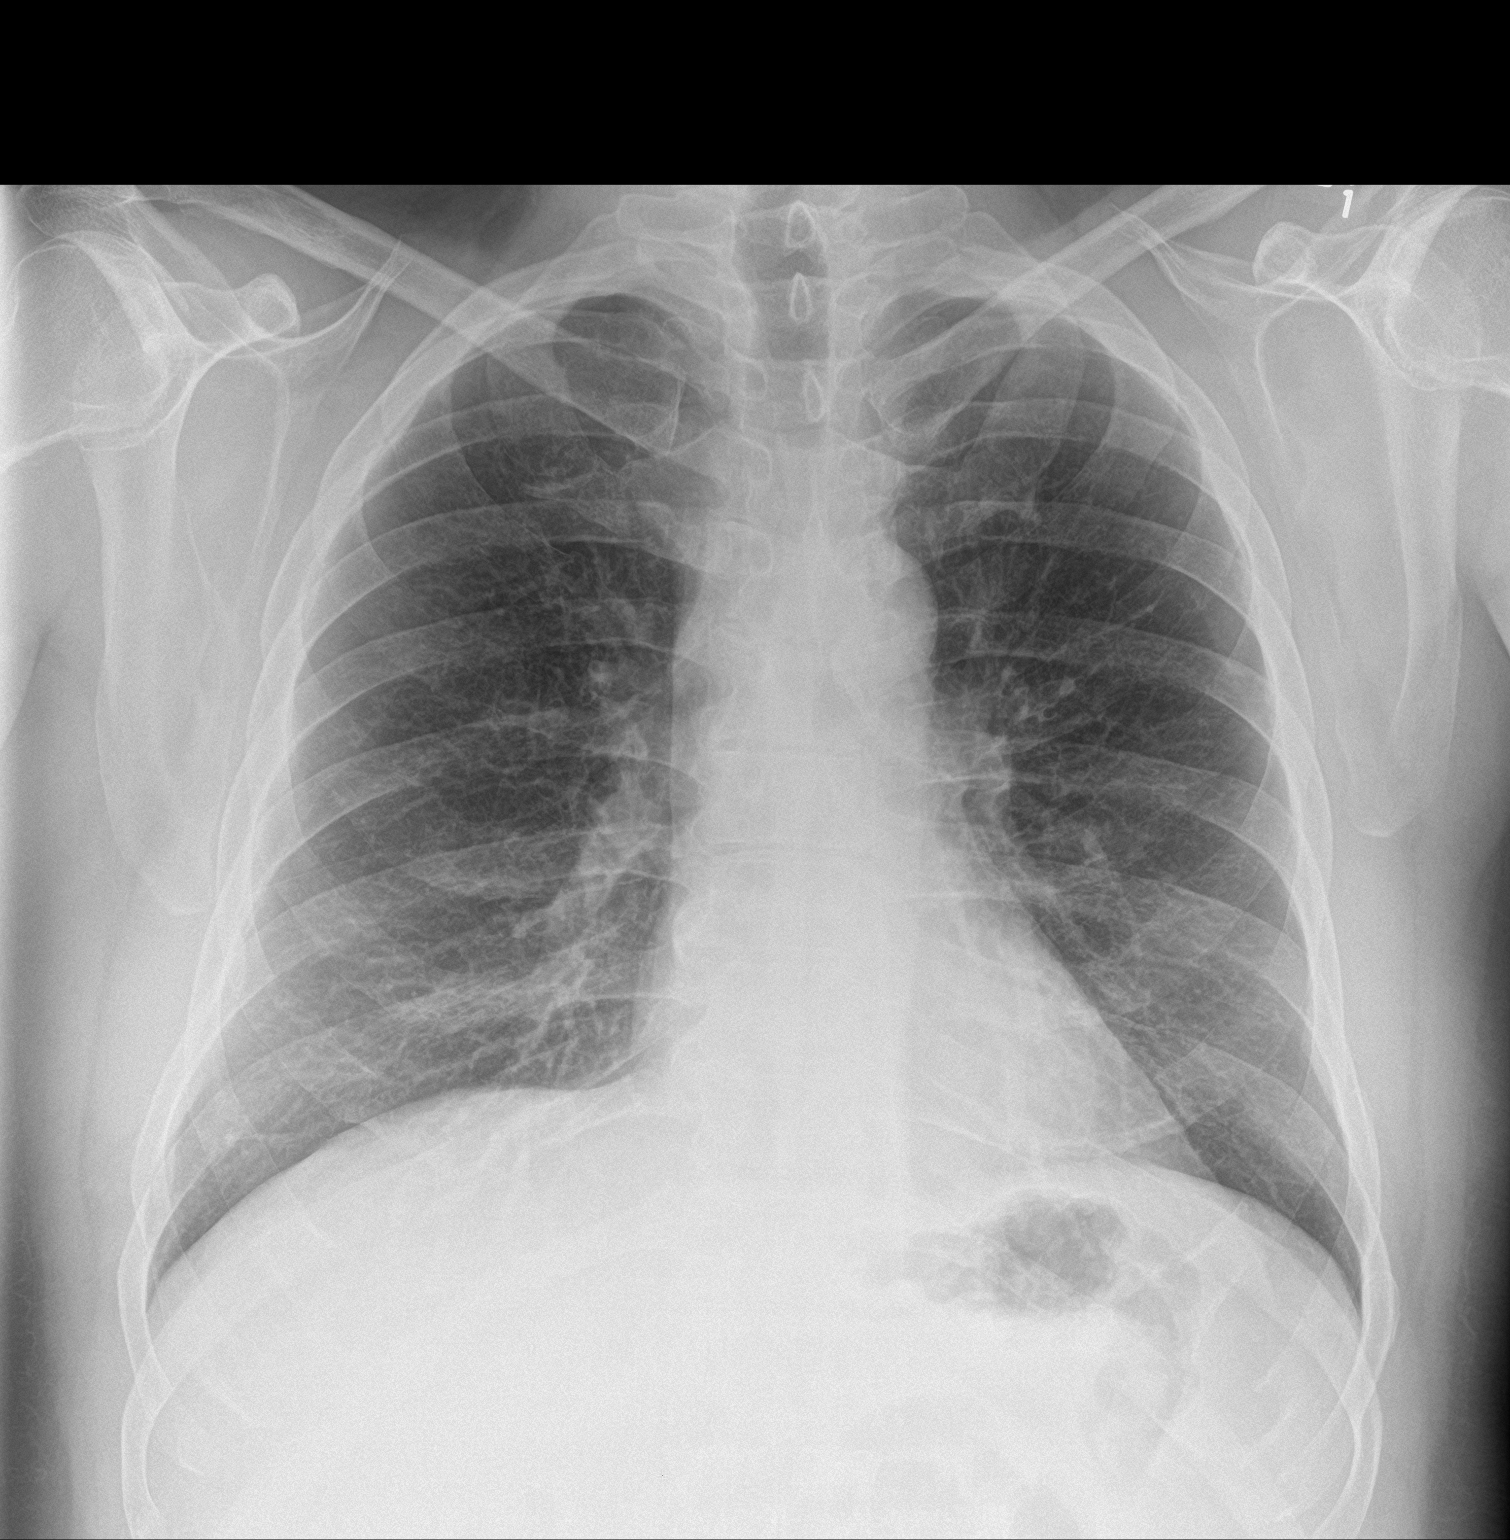
[im 2/2]
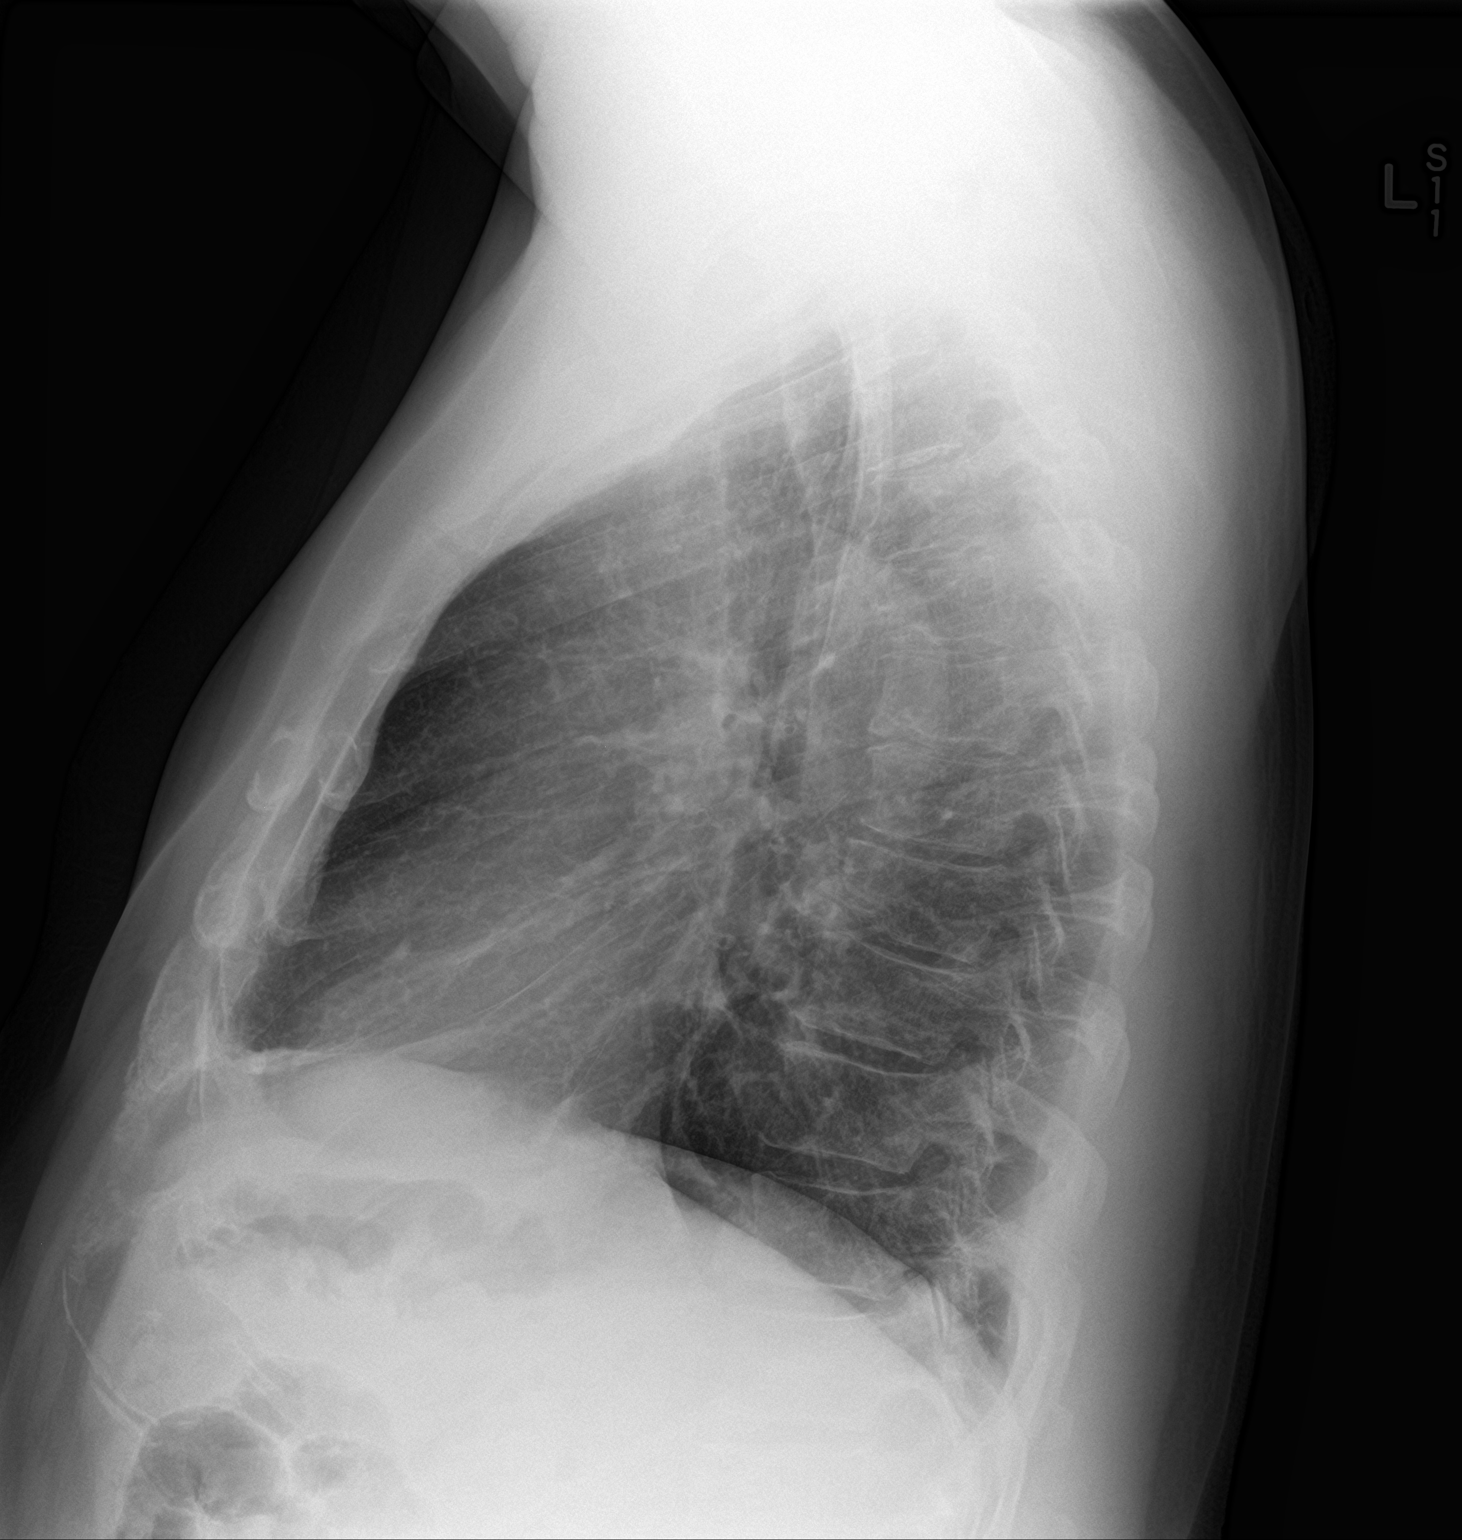

[2 of 2 positions shown; findings below may reference images not displayed]

FINDINGS: The lungs are clear wiithout focal pneumonia, edema, pneumothorax or
pleural effusion. The cardiopericardial silhouette is within normal
limits for size. The visualized bony structures of the thorax are
intact.
IMPRESSION: No active cardiopulmonary disease.

## 2017-09-30 ENCOUNTER — Ambulatory Visit: Admit: 2017-09-30 | Discharge: 2017-10-01 | Payer: MEDICAID | Attending: Sports Medicine | Primary: Sports Medicine

## 2017-09-30 DIAGNOSIS — A084 Viral intestinal infection, unspecified: Principal | ICD-10-CM

## 2017-09-30 MED ORDER — CARBAMIDE PEROXIDE 6.5 % EAR DROPS
Freq: Two times a day (BID) | OTIC | 2 refills | 0.00000 days | Status: CP | PRN
Start: 2017-09-30 — End: 2018-01-10

## 2017-09-30 MED ORDER — ONDANSETRON HCL 4 MG TABLET
ORAL_TABLET | Freq: Three times a day (TID) | ORAL | 1 refills | 0.00000 days | Status: CP | PRN
Start: 2017-09-30 — End: 2018-07-08

## 2017-10-29 MED ORDER — OMEPRAZOLE 20 MG CAPSULE,DELAYED RELEASE
ORAL_CAPSULE | ORAL | 5 refills | 0 days | Status: CP
Start: 2017-10-29 — End: 2018-02-12

## 2017-11-11 ENCOUNTER — Ambulatory Visit: Admit: 2017-11-11 | Discharge: 2017-11-11 | Disposition: A | Discharge status: Home / Self Care | Payer: MEDICAID

## 2017-11-11 MED ORDER — AZITHROMYCIN 250 MG TABLET
ORAL_TABLET | Freq: Every day | ORAL | 0 refills | 0 days | Status: CP
Start: 2017-11-11 — End: 2017-12-08

## 2017-11-11 MED ORDER — PREDNISONE 20 MG TABLET
ORAL_TABLET | 0 refills | 0 days | Status: CP
Start: 2017-11-11 — End: 2017-12-08

## 2017-12-06 ENCOUNTER — Ambulatory Visit: Admit: 2017-12-06 | Discharge: 2017-12-06 | Disposition: A | Discharge status: Home / Self Care | Payer: MEDICAID

## 2017-12-08 ENCOUNTER — Ambulatory Visit: Admit: 2017-12-08 | Discharge: 2017-12-08 | Disposition: A | Discharge status: Home / Self Care | Payer: MEDICAID

## 2017-12-08 MED ORDER — PREDNISONE 20 MG TABLET
ORAL_TABLET | Freq: Every day | ORAL | 0 refills | 0.00000 days | Status: CP
Start: 2017-12-08 — End: 2017-12-12

## 2017-12-08 MED ORDER — PROMETHAZINE 25 MG TABLET
ORAL_TABLET | Freq: Four times a day (QID) | ORAL | 0 refills | 0.00000 days | Status: CP | PRN
Start: 2017-12-08 — End: 2018-01-10

## 2017-12-10 ENCOUNTER — Ambulatory Visit: Admit: 2017-12-10 | Discharge: 2017-12-10 | Disposition: A | Discharge status: Home / Self Care | Payer: MEDICAID

## 2017-12-12 ENCOUNTER — Ambulatory Visit: Admit: 2017-12-12 | Discharge: 2017-12-13 | Payer: MEDICAID

## 2017-12-12 ENCOUNTER — Ambulatory Visit: Admit: 2017-12-12 | Discharge: 2017-12-13 | Payer: MEDICAID | Attending: Community Health | Primary: Community Health

## 2017-12-12 DIAGNOSIS — R1110 Vomiting, unspecified: Principal | ICD-10-CM

## 2017-12-12 DIAGNOSIS — R251 Tremor, unspecified: Secondary | ICD-10-CM

## 2017-12-12 DIAGNOSIS — R4702 Dysphasia: Principal | ICD-10-CM

## 2017-12-13 MED ORDER — ESCITALOPRAM 10 MG TABLET
ORAL_TABLET | Freq: Every day | ORAL | 0 refills | 0 days
Start: 2017-12-13 — End: 2018-01-10

## 2017-12-13 MED ORDER — ZIPRASIDONE 80 MG CAPSULE
ORAL_CAPSULE | Freq: Two times a day (BID) | ORAL | 0 refills | 0 days
Start: 2017-12-13 — End: 2018-07-07

## 2017-12-14 DIAGNOSIS — R4702 Dysphasia: Secondary | ICD-10-CM

## 2017-12-14 DIAGNOSIS — M542 Cervicalgia: Secondary | ICD-10-CM

## 2017-12-14 DIAGNOSIS — Z6828 Body mass index (BMI) 28.0-28.9, adult: Secondary | ICD-10-CM

## 2017-12-14 DIAGNOSIS — F17210 Nicotine dependence, cigarettes, uncomplicated: Secondary | ICD-10-CM

## 2017-12-14 DIAGNOSIS — K219 Gastro-esophageal reflux disease without esophagitis: Secondary | ICD-10-CM

## 2017-12-14 DIAGNOSIS — G43909 Migraine, unspecified, not intractable, without status migrainosus: Secondary | ICD-10-CM

## 2017-12-14 DIAGNOSIS — R29810 Facial weakness: Secondary | ICD-10-CM

## 2017-12-14 DIAGNOSIS — F319 Bipolar disorder, unspecified: Secondary | ICD-10-CM

## 2017-12-14 DIAGNOSIS — G894 Chronic pain syndrome: Secondary | ICD-10-CM

## 2017-12-14 DIAGNOSIS — F4310 Post-traumatic stress disorder, unspecified: Secondary | ICD-10-CM

## 2017-12-14 DIAGNOSIS — Z915 Personal history of self-harm: Secondary | ICD-10-CM

## 2017-12-14 DIAGNOSIS — R112 Nausea with vomiting, unspecified: Secondary | ICD-10-CM

## 2017-12-14 DIAGNOSIS — R634 Abnormal weight loss: Secondary | ICD-10-CM

## 2017-12-14 DIAGNOSIS — I10 Essential (primary) hypertension: Principal | ICD-10-CM

## 2017-12-14 DIAGNOSIS — J449 Chronic obstructive pulmonary disease, unspecified: Secondary | ICD-10-CM

## 2017-12-18 ENCOUNTER — Encounter: Admit: 2017-12-18 | Discharge: 2017-12-31 | Payer: MEDICAID

## 2017-12-18 ENCOUNTER — Inpatient Hospital Stay: Admit: 2017-12-18 | Discharge: 2017-12-31 | Payer: MEDICAID

## 2017-12-18 DIAGNOSIS — Z6828 Body mass index (BMI) 28.0-28.9, adult: Secondary | ICD-10-CM

## 2017-12-18 DIAGNOSIS — G43909 Migraine, unspecified, not intractable, without status migrainosus: Secondary | ICD-10-CM

## 2017-12-18 DIAGNOSIS — R634 Abnormal weight loss: Secondary | ICD-10-CM

## 2017-12-18 DIAGNOSIS — K219 Gastro-esophageal reflux disease without esophagitis: Secondary | ICD-10-CM

## 2017-12-18 DIAGNOSIS — M542 Cervicalgia: Secondary | ICD-10-CM

## 2017-12-18 DIAGNOSIS — F4310 Post-traumatic stress disorder, unspecified: Secondary | ICD-10-CM

## 2017-12-18 DIAGNOSIS — I10 Essential (primary) hypertension: Principal | ICD-10-CM

## 2017-12-18 DIAGNOSIS — R29810 Facial weakness: Secondary | ICD-10-CM

## 2017-12-18 DIAGNOSIS — J449 Chronic obstructive pulmonary disease, unspecified: Secondary | ICD-10-CM

## 2017-12-18 DIAGNOSIS — F17210 Nicotine dependence, cigarettes, uncomplicated: Secondary | ICD-10-CM

## 2017-12-18 DIAGNOSIS — F319 Bipolar disorder, unspecified: Secondary | ICD-10-CM

## 2017-12-18 DIAGNOSIS — G894 Chronic pain syndrome: Secondary | ICD-10-CM

## 2017-12-18 DIAGNOSIS — R4702 Dysphasia: Secondary | ICD-10-CM

## 2017-12-18 DIAGNOSIS — Z915 Personal history of self-harm: Secondary | ICD-10-CM

## 2017-12-18 DIAGNOSIS — R112 Nausea with vomiting, unspecified: Secondary | ICD-10-CM

## 2017-12-19 DIAGNOSIS — R112 Nausea with vomiting, unspecified: Secondary | ICD-10-CM

## 2017-12-19 DIAGNOSIS — R29810 Facial weakness: Secondary | ICD-10-CM

## 2017-12-19 DIAGNOSIS — Z6828 Body mass index (BMI) 28.0-28.9, adult: Secondary | ICD-10-CM

## 2017-12-19 DIAGNOSIS — M542 Cervicalgia: Secondary | ICD-10-CM

## 2017-12-19 DIAGNOSIS — I10 Essential (primary) hypertension: Principal | ICD-10-CM

## 2017-12-19 DIAGNOSIS — F4310 Post-traumatic stress disorder, unspecified: Secondary | ICD-10-CM

## 2017-12-19 DIAGNOSIS — F17210 Nicotine dependence, cigarettes, uncomplicated: Secondary | ICD-10-CM

## 2017-12-19 DIAGNOSIS — R4702 Dysphasia: Secondary | ICD-10-CM

## 2017-12-19 DIAGNOSIS — F319 Bipolar disorder, unspecified: Secondary | ICD-10-CM

## 2017-12-19 DIAGNOSIS — J449 Chronic obstructive pulmonary disease, unspecified: Secondary | ICD-10-CM

## 2017-12-19 DIAGNOSIS — K219 Gastro-esophageal reflux disease without esophagitis: Secondary | ICD-10-CM

## 2017-12-19 DIAGNOSIS — Z915 Personal history of self-harm: Secondary | ICD-10-CM

## 2017-12-19 DIAGNOSIS — G894 Chronic pain syndrome: Secondary | ICD-10-CM

## 2017-12-19 DIAGNOSIS — G43909 Migraine, unspecified, not intractable, without status migrainosus: Secondary | ICD-10-CM

## 2017-12-19 DIAGNOSIS — R634 Abnormal weight loss: Secondary | ICD-10-CM

## 2017-12-23 DIAGNOSIS — I10 Essential (primary) hypertension: Principal | ICD-10-CM

## 2017-12-23 DIAGNOSIS — G43909 Migraine, unspecified, not intractable, without status migrainosus: Secondary | ICD-10-CM

## 2017-12-23 DIAGNOSIS — G894 Chronic pain syndrome: Secondary | ICD-10-CM

## 2017-12-23 DIAGNOSIS — R112 Nausea with vomiting, unspecified: Secondary | ICD-10-CM

## 2017-12-23 DIAGNOSIS — Z6828 Body mass index (BMI) 28.0-28.9, adult: Secondary | ICD-10-CM

## 2017-12-23 DIAGNOSIS — K219 Gastro-esophageal reflux disease without esophagitis: Secondary | ICD-10-CM

## 2017-12-23 DIAGNOSIS — R29810 Facial weakness: Secondary | ICD-10-CM

## 2017-12-23 DIAGNOSIS — R634 Abnormal weight loss: Secondary | ICD-10-CM

## 2017-12-23 DIAGNOSIS — M542 Cervicalgia: Secondary | ICD-10-CM

## 2017-12-23 DIAGNOSIS — F17210 Nicotine dependence, cigarettes, uncomplicated: Secondary | ICD-10-CM

## 2017-12-23 DIAGNOSIS — R4702 Dysphasia: Secondary | ICD-10-CM

## 2017-12-23 DIAGNOSIS — Z915 Personal history of self-harm: Secondary | ICD-10-CM

## 2017-12-23 DIAGNOSIS — F319 Bipolar disorder, unspecified: Secondary | ICD-10-CM

## 2017-12-23 DIAGNOSIS — F4310 Post-traumatic stress disorder, unspecified: Secondary | ICD-10-CM

## 2017-12-23 DIAGNOSIS — J449 Chronic obstructive pulmonary disease, unspecified: Secondary | ICD-10-CM

## 2017-12-26 DIAGNOSIS — M542 Cervicalgia: Secondary | ICD-10-CM

## 2017-12-26 DIAGNOSIS — Z915 Personal history of self-harm: Secondary | ICD-10-CM

## 2017-12-26 DIAGNOSIS — Z6828 Body mass index (BMI) 28.0-28.9, adult: Secondary | ICD-10-CM

## 2017-12-26 DIAGNOSIS — F17210 Nicotine dependence, cigarettes, uncomplicated: Secondary | ICD-10-CM

## 2017-12-26 DIAGNOSIS — R634 Abnormal weight loss: Secondary | ICD-10-CM

## 2017-12-26 DIAGNOSIS — R29810 Facial weakness: Secondary | ICD-10-CM

## 2017-12-26 DIAGNOSIS — I10 Essential (primary) hypertension: Principal | ICD-10-CM

## 2017-12-26 DIAGNOSIS — F4310 Post-traumatic stress disorder, unspecified: Secondary | ICD-10-CM

## 2017-12-26 DIAGNOSIS — J449 Chronic obstructive pulmonary disease, unspecified: Secondary | ICD-10-CM

## 2017-12-26 DIAGNOSIS — K219 Gastro-esophageal reflux disease without esophagitis: Secondary | ICD-10-CM

## 2017-12-26 DIAGNOSIS — G43909 Migraine, unspecified, not intractable, without status migrainosus: Secondary | ICD-10-CM

## 2017-12-26 DIAGNOSIS — R112 Nausea with vomiting, unspecified: Secondary | ICD-10-CM

## 2017-12-26 DIAGNOSIS — G894 Chronic pain syndrome: Secondary | ICD-10-CM

## 2017-12-26 DIAGNOSIS — R4702 Dysphasia: Secondary | ICD-10-CM

## 2017-12-26 DIAGNOSIS — F319 Bipolar disorder, unspecified: Secondary | ICD-10-CM

## 2017-12-30 DIAGNOSIS — K219 Gastro-esophageal reflux disease without esophagitis: Secondary | ICD-10-CM

## 2017-12-30 DIAGNOSIS — F4310 Post-traumatic stress disorder, unspecified: Secondary | ICD-10-CM

## 2017-12-30 DIAGNOSIS — R634 Abnormal weight loss: Secondary | ICD-10-CM

## 2017-12-30 DIAGNOSIS — R4702 Dysphasia: Secondary | ICD-10-CM

## 2017-12-30 DIAGNOSIS — F319 Bipolar disorder, unspecified: Secondary | ICD-10-CM

## 2017-12-30 DIAGNOSIS — R112 Nausea with vomiting, unspecified: Secondary | ICD-10-CM

## 2017-12-30 DIAGNOSIS — Z915 Personal history of self-harm: Secondary | ICD-10-CM

## 2017-12-30 DIAGNOSIS — F17210 Nicotine dependence, cigarettes, uncomplicated: Secondary | ICD-10-CM

## 2017-12-30 DIAGNOSIS — R29810 Facial weakness: Secondary | ICD-10-CM

## 2017-12-30 DIAGNOSIS — Z6828 Body mass index (BMI) 28.0-28.9, adult: Secondary | ICD-10-CM

## 2017-12-30 DIAGNOSIS — J449 Chronic obstructive pulmonary disease, unspecified: Secondary | ICD-10-CM

## 2017-12-30 DIAGNOSIS — G43909 Migraine, unspecified, not intractable, without status migrainosus: Secondary | ICD-10-CM

## 2017-12-30 DIAGNOSIS — M542 Cervicalgia: Secondary | ICD-10-CM

## 2017-12-30 DIAGNOSIS — G894 Chronic pain syndrome: Secondary | ICD-10-CM

## 2017-12-30 DIAGNOSIS — I10 Essential (primary) hypertension: Principal | ICD-10-CM

## 2017-12-31 DIAGNOSIS — J449 Chronic obstructive pulmonary disease, unspecified: Secondary | ICD-10-CM

## 2017-12-31 DIAGNOSIS — F17210 Nicotine dependence, cigarettes, uncomplicated: Secondary | ICD-10-CM

## 2017-12-31 DIAGNOSIS — R4702 Dysphasia: Secondary | ICD-10-CM

## 2017-12-31 DIAGNOSIS — G894 Chronic pain syndrome: Secondary | ICD-10-CM

## 2017-12-31 DIAGNOSIS — M542 Cervicalgia: Secondary | ICD-10-CM

## 2017-12-31 DIAGNOSIS — R112 Nausea with vomiting, unspecified: Secondary | ICD-10-CM

## 2017-12-31 DIAGNOSIS — R634 Abnormal weight loss: Secondary | ICD-10-CM

## 2017-12-31 DIAGNOSIS — K219 Gastro-esophageal reflux disease without esophagitis: Secondary | ICD-10-CM

## 2017-12-31 DIAGNOSIS — F319 Bipolar disorder, unspecified: Secondary | ICD-10-CM

## 2017-12-31 DIAGNOSIS — F4310 Post-traumatic stress disorder, unspecified: Secondary | ICD-10-CM

## 2017-12-31 DIAGNOSIS — R29810 Facial weakness: Secondary | ICD-10-CM

## 2017-12-31 DIAGNOSIS — Z915 Personal history of self-harm: Secondary | ICD-10-CM

## 2017-12-31 DIAGNOSIS — I10 Essential (primary) hypertension: Principal | ICD-10-CM

## 2017-12-31 DIAGNOSIS — Z6828 Body mass index (BMI) 28.0-28.9, adult: Secondary | ICD-10-CM

## 2017-12-31 DIAGNOSIS — G43909 Migraine, unspecified, not intractable, without status migrainosus: Secondary | ICD-10-CM

## 2018-01-10 ENCOUNTER — Ambulatory Visit: Admit: 2018-01-10 | Discharge: 2018-01-11 | Payer: MEDICAID

## 2018-01-10 ENCOUNTER — Ambulatory Visit: Admit: 2018-01-10 | Discharge: 2018-01-11 | Payer: MEDICAID | Attending: Family Medicine | Primary: Family Medicine

## 2018-01-10 DIAGNOSIS — R251 Tremor, unspecified: Secondary | ICD-10-CM

## 2018-01-10 DIAGNOSIS — B182 Chronic viral hepatitis C: Principal | ICD-10-CM

## 2018-01-10 DIAGNOSIS — L299 Pruritus, unspecified: Secondary | ICD-10-CM

## 2018-01-10 MED ORDER — KETOCONAZOLE 2 % TOPICAL CREAM
Freq: Two times a day (BID) | TOPICAL | 0 refills | 0 days | Status: CP
Start: 2018-01-10 — End: 2018-07-08

## 2018-02-12 ENCOUNTER — Ambulatory Visit: Admit: 2018-02-12 | Discharge: 2018-02-13 | Payer: MEDICAID | Attending: Sports Medicine | Primary: Sports Medicine

## 2018-02-12 DIAGNOSIS — F39 Unspecified mood [affective] disorder: Secondary | ICD-10-CM

## 2018-02-12 DIAGNOSIS — B182 Chronic viral hepatitis C: Secondary | ICD-10-CM

## 2018-02-12 DIAGNOSIS — I10 Essential (primary) hypertension: Principal | ICD-10-CM

## 2018-02-12 MED ORDER — OMEPRAZOLE 20 MG CAPSULE,DELAYED RELEASE
ORAL_CAPSULE | Freq: Every day | ORAL | 5 refills | 0.00000 days | Status: CP
Start: 2018-02-12 — End: 2018-07-14

## 2018-02-12 MED ORDER — HYDROCHLOROTHIAZIDE 25 MG TABLET
ORAL_TABLET | Freq: Every morning | ORAL | 11 refills | 0.00000 days | Status: CP
Start: 2018-02-12 — End: 2018-03-12

## 2018-03-12 ENCOUNTER — Ambulatory Visit: Admit: 2018-03-12 | Discharge: 2018-03-13 | Payer: MEDICAID | Attending: Sports Medicine | Primary: Sports Medicine

## 2018-03-12 DIAGNOSIS — R51 Headache: Principal | ICD-10-CM

## 2018-03-12 DIAGNOSIS — G8929 Other chronic pain: Principal | ICD-10-CM

## 2018-03-12 DIAGNOSIS — J42 Unspecified chronic bronchitis: Principal | ICD-10-CM

## 2018-03-12 DIAGNOSIS — J40 Bronchitis, not specified as acute or chronic: Principal | ICD-10-CM

## 2018-03-12 DIAGNOSIS — I10 Essential (primary) hypertension: Principal | ICD-10-CM

## 2018-03-12 DIAGNOSIS — F329 Major depressive disorder, single episode, unspecified: Principal | ICD-10-CM

## 2018-03-12 DIAGNOSIS — F4310 Post-traumatic stress disorder, unspecified: Principal | ICD-10-CM

## 2018-03-12 DIAGNOSIS — F319 Bipolar disorder, unspecified: Principal | ICD-10-CM

## 2018-03-12 DIAGNOSIS — B182 Chronic viral hepatitis C: Principal | ICD-10-CM

## 2018-03-12 DIAGNOSIS — F1910 Other psychoactive substance abuse, uncomplicated: Principal | ICD-10-CM

## 2018-03-12 DIAGNOSIS — F1994 Other psychoactive substance use, unspecified with psychoactive substance-induced mood disorder: Principal | ICD-10-CM

## 2018-03-12 DIAGNOSIS — J449 Chronic obstructive pulmonary disease, unspecified: Principal | ICD-10-CM

## 2018-03-12 DIAGNOSIS — Z720 Tobacco use: Principal | ICD-10-CM

## 2018-03-12 MED ORDER — LISINOPRIL 5 MG TABLET
ORAL_TABLET | Freq: Every day | ORAL | 11 refills | 0.00000 days | Status: CP
Start: 2018-03-12 — End: 2018-04-03

## 2018-03-16 DIAGNOSIS — F329 Major depressive disorder, single episode, unspecified: Principal | ICD-10-CM

## 2018-03-16 DIAGNOSIS — F319 Bipolar disorder, unspecified: Principal | ICD-10-CM

## 2018-03-16 DIAGNOSIS — F1910 Other psychoactive substance abuse, uncomplicated: Principal | ICD-10-CM

## 2018-03-16 DIAGNOSIS — F1994 Other psychoactive substance use, unspecified with psychoactive substance-induced mood disorder: Principal | ICD-10-CM

## 2018-03-16 DIAGNOSIS — I10 Essential (primary) hypertension: Principal | ICD-10-CM

## 2018-03-16 DIAGNOSIS — J40 Bronchitis, not specified as acute or chronic: Principal | ICD-10-CM

## 2018-03-16 DIAGNOSIS — F4310 Post-traumatic stress disorder, unspecified: Principal | ICD-10-CM

## 2018-03-16 DIAGNOSIS — J449 Chronic obstructive pulmonary disease, unspecified: Principal | ICD-10-CM

## 2018-03-16 DIAGNOSIS — G8929 Other chronic pain: Principal | ICD-10-CM

## 2018-03-16 DIAGNOSIS — R51 Headache: Principal | ICD-10-CM

## 2018-03-21 ENCOUNTER — Ambulatory Visit: Admit: 2018-03-21 | Discharge: 2018-03-21 | Payer: MEDICAID

## 2018-04-03 ENCOUNTER — Institutional Professional Consult (permissible substitution): Admit: 2018-04-03 | Discharge: 2018-04-04 | Payer: MEDICAID | Attending: Sports Medicine | Primary: Sports Medicine

## 2018-04-03 DIAGNOSIS — I10 Essential (primary) hypertension: Principal | ICD-10-CM

## 2018-04-03 DIAGNOSIS — J42 Unspecified chronic bronchitis: Secondary | ICD-10-CM

## 2018-04-03 DIAGNOSIS — Z720 Tobacco use: Secondary | ICD-10-CM

## 2018-04-03 DIAGNOSIS — B182 Chronic viral hepatitis C: Secondary | ICD-10-CM

## 2018-04-03 MED ORDER — LOSARTAN 25 MG TABLET
ORAL_TABLET | Freq: Every day | ORAL | 11 refills | 0.00000 days | Status: CP
Start: 2018-04-03 — End: 2018-04-03

## 2018-04-03 MED ORDER — LOSARTAN 25 MG TABLET: 25 mg | tablet | Freq: Every day | 11 refills | 0 days | Status: AC

## 2018-04-09 ENCOUNTER — Telehealth: Admit: 2018-04-09 | Discharge: 2018-04-10 | Payer: MEDICAID | Attending: Gastroenterology | Primary: Gastroenterology

## 2018-04-09 DIAGNOSIS — B182 Chronic viral hepatitis C: Principal | ICD-10-CM

## 2018-04-23 ENCOUNTER — Ambulatory Visit: Admit: 2018-04-23 | Discharge: 2018-04-24 | Payer: MEDICAID

## 2018-04-23 DIAGNOSIS — B182 Chronic viral hepatitis C: Principal | ICD-10-CM

## 2018-05-05 NOTE — Unmapped (Signed)
Reviewed patient with Dr. Foy Guadalajara and will proceed with HCV treatment approval. Reached out to pt but unable to reach. Left message for pt to return my call.    Pt returned call. We reviewed starting treatment with Mavyret. We discussed the prior authorization (PA) process of obtaining the medication through insurance and that this may take some time. Reviewed need to complete Beneficiary Readiness Evaluation for Medicaid in order to complete PA process. Pt request one to be mailed as he does not have access to the Internet.     B18.2 Hep C: yes    K74.60 Cirrhosis: no, based on APRI 0.609 and FIB-4 1.72  Child Pugh Score if applicable and for Medicaid pts: n/a  Z94.4 Liver Transplant: no    Genotype: 1a (12/12/17)  HCV RNA:  1,610,960 IU/ml on 12/12/17   Fibrosis score: no cirrhosis based on APRI (0.609) and FIB-4 (1.72)  HIV Co-infection? no  Signs of liver decompensation? no  Previous treatment? naive    Planned regimen: Mavyret (glecaprevir/pibrentasvir 100/40 mg) 3 tabs daily x 8 weeks  Urgency: Routine Request    Prescribing Provider/NPI: Dr. Gavin Potters / [id number]  Signature waiver form not obtained at this time.  Insurance: [place] Medicaid    Current medications: Geodon, omeprazole, Valium, losartan, doxepin, CBD oil     Following topics were discussed during counseling:    1. Indications for medication, dosage and administration.     A. Mavyret (100/40 mg) 3 tablets to take once daily with food.    2. Common side effects of medications and management strategies. (fatigue, headache)    3. Importance of adherence to regimen, follow-up clinic visits and lab monitoring.    4. Drug-drug interaction.    A. Current medications have been reviewed and assessed for possible interaction. Denies use of herbal medication such as milk thistle or St. John's wart. Recommended to avoid use of CBD oil.  Allergies have been verified. Denies alcohol.     Patient verbalized understanding. Provided contact information for any questions/concerns.       Park Breed, Pharm D., BCPS, BCGP, CPP  [facility]  [address]  [place], [place] [address]  [phone]

## 2018-05-06 MED ORDER — GLECAPREVIR 100 MG-PIBRENTASVIR 40 MG TABLET
Freq: Every day | ORAL | 1 refills | 0.00000 days | Status: CP
Start: 2018-05-06 — End: ?
  Filled 2018-06-05: qty 1, 28d supply, fill #0

## 2018-05-06 NOTE — Unmapped (Signed)
Per test claim for Mavyret at the [facility], patient needs Medication Assistance Program for Prior Authorization.

## 2018-06-02 NOTE — Unmapped (Signed)
[facility] Specialty Medication Referral: PA Approved      Medication (Brand/Generic): MAVYRET    Final Test Claim completed with resulted information below:    Patient ABLE to fill at [facility]  Insurance Company:  [place] MEDICAID  Anticipated Copay: $3 PER REFERRAL  Is anticipated copay with a copay card or grant? No, there is no need for grant or copay assistance.      If the copay is under the $25 defined limit, per policy there will be no further investigation of need for financial assistance at this time unless patient requests. This referral has been communicated to the provider and handed off to the [facility] team for further processing and filling of prescribed medication.   ______________________________________________________________________  Please utilize this referral for viewing purposes as it will serve as the central location for all relevant documentation and updates.

## 2018-06-03 NOTE — Unmapped (Addendum)
Initial Counseling for HCV treatment     Planned regimen: Mavyret (glecaprevir/pibrentasvir 100/40 mg) 3 tabs daily x 8 weeks  Planned start date: 06/06/18, Friday    Pharmacy: [facility] [phone] option #4  The [facility] [place] [facility] pharmacy is open Monday through Friday 8:30am-4:30pm.  A pharmacist is available 24/7 via pager to answer any clinical questions they may have.    PMH:   Past Medical History:   Diagnosis Date   ??? Bipolar 1 disorder (CMS-HCC)    ??? Bronchitis    ??? Chronic pain    ??? COPD (chronic obstructive pulmonary disease) (CMS-HCC)    ??? Depression    ??? Headache(784.0)    ??? Hypertension    ??? Post traumatic stress disorder (PTSD)    ??? Substance abuse (CMS-HCC)    ??? Substance induced mood disorder (CMS-HCC) 10/16/2013     Current Meds: Geodon, omeprazole, diazepam, doxepin, losartan, CBD oil    HIPAA information was verified with patient. Verified therapy is appropriate for patient. Patient is ready to start Mavyret.    Following topics were discussed during counseling:   Patient Counseling    Counseled the patient on the following:  doses and administration discussed, safe handling, storage, and disposal discussed, possible adverse effects and management discussed, possible drug and prescription drug interactions discussed, possible drug and OTC drug and food interactions discussed, lab monitoring and follow-up discussed, cost of medications and cost implications discussed, adherence and missed doses discussed, pharmacy contact information discussed        1. Indications for medication, dosage and administration.     A. Mavyret (100/40 mg) 3 tablets to take once daily with food. Patient plans to take in the afternoon between 2-3pm.     2. Common side effects of medications and management strategies. (fatigue, headache)      3. Importance of adherence to regimen, follow-up clinic visits and lab monitoring.   Medication Adherence    Demonstrates understanding of importance of adherence: yes  Informant:  patient  Patient is at risk for Non-Adherence:  No  Reasons for non-adherence:  no problems identified  Confirmed plan for next specialty medication refill:  delivery by pharmacy       A.  Reviewed treatment follow up protocol including TW#4, EOT and Post-TW#12 to assess for cure. Advised due to Covid, follow up may be virtual visit.      4. Drug-drug interaction.  Drug Interactions    Drug interactions evaluated:  yes  Clinically relevant drug interactions identified:  no       A. Current medications have been reviewed and assessed for possible interaction.    Denies use of herbal medication such as milk thistle or St. John's wart.  Pt still had not discussed despite counseling on 05/05/18. He agrees to discontinue today. Allergies have been verified. Denies alcohol.     5. Importance of informing pharmacy and clinic of updated contact information.  Stressed importance of being able to reach over the phone to set up refills. Advised patient to call pharmacy when down to about 7 day supply left to ensure there's no interruption in therapy.  Refill Coordination    Has the Patients' Contact Information Changed:  No  Is the Shipping Address Different:  No     Sent message to [facility] Scheduling pool.  Shipping Information    Delivery Date:  06/05/18  Medications to be Shipped:  Mavyret         Patient specific needs were  assessed and addressed: language differences, literacy level, cultural barriers, cognitive and/or physical impairments. Informed patient that welcome packet will be sent.    Patient verbalized understanding of counseling. Provided contact information for any further questions/concerns.     Park Breed, Pharm D., BCPS, BCGP, CPP  [facility]  [address]  [place], [place] [address]  [phone]      June 03, 2018 1:28 PM

## 2018-06-04 NOTE — Unmapped (Signed)
Alex Washington 's MAVYRET 100-40 mg tablet (glecaprevir-pibrentasvir) shipment will be delayed due to Prior Authorization Required We have contacted the patient and communicated the delivery change to patient/caregiver We will call the patient to reschedule the delivery upon resolution. We have confirmed the delivery date as NA . Previous PA was submitted for wrong qty and day supply.

## 2018-06-05 MED FILL — MAVYRET 100 MG-40 MG TABLET: 28 days supply | Qty: 1 | Fill #0 | Status: AC

## 2018-06-05 NOTE — Unmapped (Signed)
Lonia Mad called back about the delivery for MAVYRET 100-40 mg tablet (glecaprevir-pibrentasvir)  and would like the delivery to ship out 06/05/2018 via [employer] or [facility] to be delivered 06/05/2018 . We have confirmed the delivery via [facility] .

## 2018-06-20 NOTE — Unmapped (Signed)
[facility] Clinical Assessment & Refill Coordination Note    Alex Washington, DOB: [birth year]  Phone: [phone] (home)     All above HIPAA information was verified with patient.     Specialty Medication(s):   Infectious Disease: Mavyret     Current Outpatient Medications   Medication Sig Dispense Refill   ??? albuterol 2.5 mg /3 mL (0.083 %) nebulizer solution Inhale 3 mL (2.5 mg total) by nebulization every four (4) hours as needed for wheezing. 25 vial 2   ??? diazePAM (VALIUM) 5 MG tablet Take 5 mg by mouth two (2) times a day as needed for anxiety.     ??? doxepin (SINEQUAN) 50 MG capsule TAKE 1 BY MOUTH DAILY AT BEDTIME WHEN NECESSARY FOR SLEEP  3   ??? glecaprevir-pibrentasvir (MAVYRET) 100-40 mg tablet Take 3 tablets by mouth daily. Take with food. 1 4-week carton 1   ??? ketoconazole (NIZORAL) 2 % cream Apply 1 application topically Two (2) times a day. 30 g 0   ??? losartan (COZAAR) 25 MG tablet Take 0.5 tablets (12.5 mg total) by mouth daily. For high blood pressure 30 tablet 11   ??? omeprazole (PRILOSEC) 20 MG capsule Take 1 capsule (20 mg total) by mouth daily. 60 capsule 5   ??? ondansetron (ZOFRAN) 4 MG tablet Take 1 tablet (4 mg total) by mouth every eight (8) hours as needed (nausea, vomiting). 10 tablet 1   ??? polyethylene glycol (MIRALAX) 17 gram packet Take 17 g by mouth daily as needed (constipation).     ??? ziprasidone (GEODON) 80 MG capsule Take 1 capsule (80 mg total) by mouth 2 (two) times a day with meals. For Mood, Psychosis. 60 capsule 0     No current facility-administered medications for this visit.         Changes to medications: [name] reports no changes at this time.    Allergies   Allergen Reactions   ??? Morphine Hives and Swelling   ??? Amlodipine Dizziness   ??? Gabapentin Other (See Comments)     Feels like he is drunk   ??? Hctz [Hydrochlorothiazide] Dizziness   ??? Ingrezza [Valbenazine]      Caused shaking   ??? Lisinopril Cough   ??? Viibryd [Vilazodone] Couldn't eat, lost 32 pounds in a month       Changes to allergies: No    SPECIALTY MEDICATION ADHERENCE     Mavyret   : 14  days of medicine on hand after today's dose      Specialty medication(s) dose(s) confirmed: Regimen is correct and unchanged.     Are there any concerns with adherence? No    Adherence counseling provided? Not needed    CLINICAL MANAGEMENT AND INTERVENTION      Clinical Benefit Assessment:    Do you feel the medicine is effective or helping your condition? Yes    Clinical Benefit counseling provided? Not needed    Adverse Effects Assessment:    Are you experiencing any side effects? Yes, patient reports experiencing itching for 3 to 4 days in the beginning and a couple of headaches that he attributes to getting new dentures.. Side effect counseling provided: none needed since not really having any at this time.    Are you experiencing difficulty administering your medicine? No    Quality of Life Assessment:    How many days over the past month did your hepatitis C  keep you from your normal activities? For example, brushing  your teeth or getting up in the morning. 0    Have you discussed this with your provider? Not needed    Therapy Appropriateness:    Is therapy appropriate? Yes, therapy is appropriate and should be continued    DISEASE/MEDICATION-SPECIFIC INFORMATION      For Hepatitis C patients:  Regimen: Mavyret x 8 weeks  Therapy start date: 06/07/2018  Completed Treatment Week #: yes  What time of day do you take your medicine? between 1:30 and 2:00pm every day  Pill count over the phone revealed #45 (includes today's dose) tablets which is appropriate.    Are you taking any new OTC or herbal medication? No  Any alcoholic beverages? No  Do you have a follow up appointment? Yes, appointment is scheduled, patient is aware, and no identified barriers    PATIENT SPECIFIC NEEDS     ? Does the patient have any physical, cognitive, or cultural barriers? No    ? Is the patient high risk? No ? Does the patient require a Care Management Plan? No     ? Does the patient require physician intervention or other additional services (i.e. nutrition, smoking cessation, social work)? No      SHIPPING     Specialty Medication(s) to be Shipped:   Infectious Disease: Mavyret    Other medication(s) to be shipped: n/a     Changes to insurance: No    Delivery Scheduled: Yes, Expected medication delivery date: 06/27/2018.     Medication will be delivered via Next Day Courier to the confirmed home address in [facility].    The patient will receive a drug information handout for each medication shipped and additional FDA Medication Guides as required.  Verified that patient has previously received a Conservation officer, historic buildings.    All of the patient's questions and concerns have been addressed.    Roderic Palau   [facility] Specialty Pharmacist

## 2018-06-20 NOTE — Unmapped (Signed)
This patient has been disenrolled from the [facility] specialty pharmacy services due to therapy completion.    Alex Washington  [facility] Specialty Pharmacist

## 2018-06-26 MED FILL — MAVYRET 100 MG-40 MG TABLET: 28 days supply | Qty: 1 | Fill #1 | Status: AC

## 2018-06-26 MED FILL — MAVYRET 100 MG-40 MG TABLET: ORAL | 28 days supply | Qty: 1 | Fill #1

## 2018-07-01 ENCOUNTER — Ambulatory Visit: Admit: 2018-07-01 | Discharge: 2018-07-02 | Payer: MEDICAID

## 2018-07-01 DIAGNOSIS — M542 Cervicalgia: Principal | ICD-10-CM

## 2018-07-01 DIAGNOSIS — M47812 Spondylosis without myelopathy or radiculopathy, cervical region: Principal | ICD-10-CM

## 2018-07-08 ENCOUNTER — Institutional Professional Consult (permissible substitution): Admit: 2018-07-08 | Discharge: 2018-07-09 | Payer: MEDICAID | Attending: Family | Primary: Family

## 2018-07-08 DIAGNOSIS — B182 Chronic viral hepatitis C: Principal | ICD-10-CM

## 2018-07-11 ENCOUNTER — Ambulatory Visit: Admit: 2018-07-11 | Discharge: 2018-07-12 | Payer: MEDICAID

## 2018-07-11 DIAGNOSIS — B182 Chronic viral hepatitis C: Principal | ICD-10-CM

## 2018-07-14 MED ORDER — OMEPRAZOLE 20 MG CAPSULE,DELAYED RELEASE
ORAL_CAPSULE | Freq: Every day | ORAL | 5 refills | 60 days | Status: CP
Start: 2018-07-14 — End: 2018-07-31

## 2018-07-31 ENCOUNTER — Institutional Professional Consult (permissible substitution): Admit: 2018-07-31 | Discharge: 2018-08-01 | Payer: MEDICAID

## 2018-07-31 DIAGNOSIS — K219 Gastro-esophageal reflux disease without esophagitis: Principal | ICD-10-CM

## 2018-07-31 MED ORDER — OMEPRAZOLE 20 MG CAPSULE,DELAYED RELEASE
ORAL_CAPSULE | Freq: Two times a day (BID) | ORAL | 3 refills | 45 days | Status: CP
Start: 2018-07-31 — End: 2019-07-31

## 2018-08-07 ENCOUNTER — Ambulatory Visit: Admit: 2018-08-07 | Discharge: 2018-08-08 | Payer: MEDICAID

## 2018-08-07 ENCOUNTER — Institutional Professional Consult (permissible substitution): Admit: 2018-08-07 | Discharge: 2018-08-08 | Payer: MEDICAID | Attending: Family | Primary: Family

## 2018-08-07 DIAGNOSIS — B182 Chronic viral hepatitis C: Principal | ICD-10-CM

## 2018-08-07 DIAGNOSIS — M47812 Spondylosis without myelopathy or radiculopathy, cervical region: Secondary | ICD-10-CM

## 2018-08-07 DIAGNOSIS — M542 Cervicalgia: Principal | ICD-10-CM

## 2018-08-07 DIAGNOSIS — M4302 Spondylolysis, cervical region: Principal | ICD-10-CM

## 2018-08-12 ENCOUNTER — Ambulatory Visit: Admit: 2018-08-12 | Discharge: 2018-08-13 | Payer: MEDICAID

## 2018-08-12 DIAGNOSIS — B182 Chronic viral hepatitis C: Principal | ICD-10-CM

## 2018-08-17 ENCOUNTER — Ambulatory Visit
Admit: 2018-08-17 | Discharge: 2018-08-17 | Disposition: A | Discharge status: Home / Self Care | Payer: MEDICAID | Attending: Family

## 2018-10-22 ENCOUNTER — Institutional Professional Consult (permissible substitution): Admit: 2018-10-22 | Discharge: 2018-10-23 | Payer: MEDICAID | Attending: Sports Medicine | Primary: Sports Medicine

## 2018-10-22 MED ORDER — ALBUTEROL SULFATE 2.5 MG/3 ML (0.083 %) SOLUTION FOR NEBULIZATION: 3 mg | vial | 2 refills | 5 days | Status: AC

## 2018-10-22 MED ORDER — PREDNISONE 20 MG TABLET: 40 mg | tablet | Freq: Every day | 0 refills | 5 days | Status: AC

## 2018-10-22 MED ORDER — BUDESONIDE 0.5 MG/2 ML SUSPENSION FOR NEBULIZATION: 1 mg | mL | Freq: Two times a day (BID) | 11 refills | 30 days | Status: AC

## 2018-12-03 ENCOUNTER — Ambulatory Visit: Admit: 2018-12-03 | Discharge: 2018-12-03 | Payer: MEDICAID | Attending: Family | Primary: Family

## 2018-12-03 ENCOUNTER — Ambulatory Visit: Admit: 2018-12-03 | Discharge: 2018-12-03 | Payer: MEDICAID | Attending: Gastroenterology | Primary: Gastroenterology

## 2018-12-03 DIAGNOSIS — B182 Chronic viral hepatitis C: Principal | ICD-10-CM

## 2018-12-03 DIAGNOSIS — Z9229 Personal history of other drug therapy: Principal | ICD-10-CM

## 2018-12-03 DIAGNOSIS — Z23 Encounter for immunization: Principal | ICD-10-CM

## 2018-12-12 ENCOUNTER — Institutional Professional Consult (permissible substitution): Admit: 2018-12-12 | Discharge: 2018-12-13 | Payer: MEDICAID

## 2018-12-12 MED ORDER — NICOTINE 14 MG/24 HR DAILY TRANSDERMAL PATCH
MEDICATED_PATCH | TRANSDERMAL | 0 refills | 30 days | Status: CP
Start: 2018-12-12 — End: ?

## 2018-12-12 MED ORDER — NICOTINE 21 MG/24 HR DAILY TRANSDERMAL PATCH
MEDICATED_PATCH | TRANSDERMAL | 0 refills | 30.00000 days | Status: CP
Start: 2018-12-12 — End: ?

## 2018-12-12 MED ORDER — NICOTINE (POLACRILEX) 4 MG BUCCAL LOZENGE
LOZENGE | BUCCAL | 6 refills | 10.00000 days | Status: CP | PRN
Start: 2018-12-12 — End: ?

## 2018-12-12 MED ORDER — ARFORMOTEROL 15 MCG/2 ML SOLUTION FOR NEBULIZATION
Freq: Two times a day (BID) | RESPIRATORY_TRACT | 11 refills | 30 days | Status: CP
Start: 2018-12-12 — End: 2019-12-12

## 2018-12-15 DIAGNOSIS — J42 Unspecified chronic bronchitis: Principal | ICD-10-CM

## 2018-12-16 DIAGNOSIS — J449 Chronic obstructive pulmonary disease, unspecified: Principal | ICD-10-CM

## 2018-12-18 ENCOUNTER — Ambulatory Visit
Admit: 2018-12-18 | Discharge: 2018-12-18 | Payer: MEDICAID | Attending: Physical Medicine & Rehabilitation | Primary: Physical Medicine & Rehabilitation

## 2018-12-18 DIAGNOSIS — G8929 Other chronic pain: Secondary | ICD-10-CM

## 2018-12-18 DIAGNOSIS — M545 Low back pain: Secondary | ICD-10-CM

## 2018-12-18 DIAGNOSIS — M533 Sacrococcygeal disorders, not elsewhere classified: Secondary | ICD-10-CM

## 2018-12-18 DIAGNOSIS — M7918 Myalgia, other site: Principal | ICD-10-CM

## 2018-12-22 DIAGNOSIS — M533 Sacrococcygeal disorders, not elsewhere classified: Principal | ICD-10-CM

## 2018-12-22 DIAGNOSIS — G8929 Other chronic pain: Principal | ICD-10-CM

## 2018-12-22 MED ORDER — METHYLPREDNISOLONE 4 MG TABLETS IN A DOSE PACK
PACK | 0 refills | 0 days | Status: CP
Start: 2018-12-22 — End: ?

## 2018-12-24 MED ORDER — METHYLPREDNISOLONE 4 MG TABLETS IN A DOSE PACK
PACK | 0 refills | 0 days | Status: CP
Start: 2018-12-24 — End: ?

## 2019-01-05 ENCOUNTER — Ambulatory Visit: Admit: 2019-01-05 | Discharge: 2019-01-06 | Payer: MEDICAID

## 2019-01-05 ENCOUNTER — Ambulatory Visit
Admit: 2019-01-05 | Discharge: 2019-01-06 | Payer: MEDICAID | Attending: Physical Medicine & Rehabilitation | Primary: Physical Medicine & Rehabilitation

## 2019-01-05 DIAGNOSIS — M533 Sacrococcygeal disorders, not elsewhere classified: Principal | ICD-10-CM

## 2019-02-13 MED ORDER — OMEPRAZOLE 20 MG CAPSULE,DELAYED RELEASE
ORAL_CAPSULE | Freq: Two times a day (BID) | ORAL | 2 refills | 45 days | Status: CP
Start: 2019-02-13 — End: 2020-02-13

## 2019-02-18 ENCOUNTER — Ambulatory Visit
Admit: 2019-02-18 | Discharge: 2019-02-19 | Payer: MEDICAID | Attending: Physical Medicine & Rehabilitation | Primary: Physical Medicine & Rehabilitation

## 2019-02-18 DIAGNOSIS — G8929 Other chronic pain: Secondary | ICD-10-CM

## 2019-02-18 DIAGNOSIS — M533 Sacrococcygeal disorders, not elsewhere classified: Principal | ICD-10-CM

## 2019-02-18 DIAGNOSIS — M7918 Myalgia, other site: Principal | ICD-10-CM

## 2019-02-18 DIAGNOSIS — M545 Low back pain: Secondary | ICD-10-CM

## 2019-03-11 DIAGNOSIS — M47812 Spondylosis without myelopathy or radiculopathy, cervical region: Principal | ICD-10-CM

## 2019-03-16 DIAGNOSIS — M533 Sacrococcygeal disorders, not elsewhere classified: Principal | ICD-10-CM

## 2019-03-16 DIAGNOSIS — G8929 Other chronic pain: Principal | ICD-10-CM

## 2019-03-24 ENCOUNTER — Ambulatory Visit: Admit: 2019-03-24 | Discharge: 2019-03-25 | Payer: MEDICAID

## 2019-03-24 DIAGNOSIS — M47812 Spondylosis without myelopathy or radiculopathy, cervical region: Principal | ICD-10-CM

## 2019-04-09 ENCOUNTER — Ambulatory Visit: Admit: 2019-04-09 | Discharge: 2019-04-10 | Payer: MEDICAID

## 2019-04-16 ENCOUNTER — Ambulatory Visit: Admit: 2019-04-16 | Discharge: 2019-04-17 | Payer: MEDICAID

## 2019-04-16 DIAGNOSIS — M47812 Spondylosis without myelopathy or radiculopathy, cervical region: Principal | ICD-10-CM

## 2019-04-17 ENCOUNTER — Ambulatory Visit: Admit: 2019-04-17 | Discharge: 2019-04-18 | Payer: MEDICAID

## 2019-04-17 DIAGNOSIS — I10 Essential (primary) hypertension: Principal | ICD-10-CM

## 2019-04-17 DIAGNOSIS — Z1211 Encounter for screening for malignant neoplasm of colon: Principal | ICD-10-CM

## 2019-04-17 MED ORDER — LOSARTAN 25 MG TABLET
ORAL_TABLET | Freq: Every day | ORAL | 11 refills | 30.00000 days | Status: CP
Start: 2019-04-17 — End: 2020-04-16

## 2019-04-30 ENCOUNTER — Ambulatory Visit
Admit: 2019-04-30 | Discharge: 2019-04-30 | Payer: MEDICAID | Attending: Physical Medicine & Rehabilitation | Primary: Physical Medicine & Rehabilitation

## 2019-04-30 ENCOUNTER — Ambulatory Visit: Admit: 2019-04-30 | Discharge: 2019-04-30 | Payer: MEDICAID

## 2019-04-30 DIAGNOSIS — M533 Sacrococcygeal disorders, not elsewhere classified: Principal | ICD-10-CM

## 2019-05-11 ENCOUNTER — Ambulatory Visit: Admit: 2019-05-11 | Discharge: 2019-05-12 | Payer: MEDICAID

## 2019-05-11 MED ORDER — OMEPRAZOLE 20 MG CAPSULE,DELAYED RELEASE
ORAL_CAPSULE | Freq: Two times a day (BID) | ORAL | 1 refills | 0 days
Start: 2019-05-11 — End: ?

## 2019-05-11 MED ORDER — LOSARTAN 50 MG TABLET
ORAL_TABLET | Freq: Every day | ORAL | 3 refills | 90.00000 days | Status: CP
Start: 2019-05-11 — End: 2020-05-10

## 2019-05-12 MED ORDER — OMEPRAZOLE 20 MG CAPSULE,DELAYED RELEASE
ORAL_CAPSULE | Freq: Two times a day (BID) | ORAL | 1 refills | 90 days | Status: CP
Start: 2019-05-12 — End: 2020-05-11

## 2019-06-08 ENCOUNTER — Ambulatory Visit: Admit: 2019-06-08 | Discharge: 2019-06-08 | Disposition: A | Discharge status: Home / Self Care | Payer: MEDICAID

## 2019-06-08 DIAGNOSIS — G43809 Other migraine, not intractable, without status migrainosus: Principal | ICD-10-CM

## 2019-06-08 DIAGNOSIS — I10 Essential (primary) hypertension: Principal | ICD-10-CM

## 2019-06-15 ENCOUNTER — Ambulatory Visit: Admit: 2019-06-15 | Discharge: 2019-06-16 | Payer: MEDICAID | Attending: Sports Medicine | Primary: Sports Medicine

## 2019-06-15 MED ORDER — CHLORTHALIDONE 25 MG TABLET
ORAL_TABLET | Freq: Every morning | ORAL | 11 refills | 30.00000 days | Status: CP
Start: 2019-06-15 — End: 2020-06-14

## 2019-06-22 MED ORDER — LOSARTAN 100 MG TABLET
ORAL_TABLET | Freq: Every day | ORAL | 3 refills | 90 days | Status: CP
Start: 2019-06-22 — End: 2020-06-21

## 2019-07-01 ENCOUNTER — Ambulatory Visit
Admit: 2019-07-01 | Discharge: 2019-07-02 | Payer: MEDICAID | Attending: Student in an Organized Health Care Education/Training Program | Primary: Student in an Organized Health Care Education/Training Program

## 2019-07-01 DIAGNOSIS — R55 Syncope and collapse: Principal | ICD-10-CM

## 2019-07-01 DIAGNOSIS — G8929 Other chronic pain: Principal | ICD-10-CM

## 2019-07-01 DIAGNOSIS — I10 Essential (primary) hypertension: Principal | ICD-10-CM

## 2019-07-01 DIAGNOSIS — M533 Sacrococcygeal disorders, not elsewhere classified: Principal | ICD-10-CM

## 2019-07-01 MED ORDER — EPLERENONE 25 MG TABLET
ORAL_TABLET | Freq: Every day | ORAL | 11 refills | 30 days | Status: CP
Start: 2019-07-01 — End: 2020-06-30

## 2019-07-02 ENCOUNTER — Ambulatory Visit
Admit: 2019-07-02 | Discharge: 2019-07-02 | Payer: MEDICAID | Attending: Physical Medicine & Rehabilitation | Primary: Physical Medicine & Rehabilitation

## 2019-07-02 DIAGNOSIS — M545 Low back pain: Principal | ICD-10-CM

## 2019-07-02 DIAGNOSIS — G8929 Other chronic pain: Principal | ICD-10-CM

## 2019-07-02 DIAGNOSIS — M7918 Myalgia, other site: Principal | ICD-10-CM

## 2019-07-02 DIAGNOSIS — M533 Sacrococcygeal disorders, not elsewhere classified: Principal | ICD-10-CM

## 2019-07-04 DIAGNOSIS — I10 Essential (primary) hypertension: Principal | ICD-10-CM

## 2019-07-10 ENCOUNTER — Ambulatory Visit: Admit: 2019-07-10 | Discharge: 2019-07-11 | Payer: MEDICAID

## 2019-07-10 DIAGNOSIS — J449 Chronic obstructive pulmonary disease, unspecified: Principal | ICD-10-CM

## 2019-07-10 MED ORDER — STIOLTO RESPIMAT 2.5 MCG-2.5 MCG/ACTUATION SOLUTION FOR INHALATION
Freq: Every day | RESPIRATORY_TRACT | 11 refills | 31.00000 days | Status: CP
Start: 2019-07-10 — End: 2020-07-09

## 2019-07-15 DIAGNOSIS — M47812 Spondylosis without myelopathy or radiculopathy, cervical region: Principal | ICD-10-CM

## 2019-07-15 DIAGNOSIS — M542 Cervicalgia: Principal | ICD-10-CM

## 2019-08-04 ENCOUNTER — Ambulatory Visit: Admit: 2019-08-04 | Discharge: 2019-08-05 | Payer: MEDICAID

## 2019-09-08 ENCOUNTER — Ambulatory Visit: Admit: 2019-09-08 | Discharge: 2019-09-09 | Payer: MEDICAID

## 2019-09-08 DIAGNOSIS — M47812 Spondylosis without myelopathy or radiculopathy, cervical region: Principal | ICD-10-CM

## 2019-09-08 DIAGNOSIS — M7918 Myalgia, other site: Principal | ICD-10-CM

## 2019-09-25 MED ORDER — OMEPRAZOLE 20 MG CAPSULE,DELAYED RELEASE
ORAL_CAPSULE | Freq: Two times a day (BID) | ORAL | 1 refills | 90 days | Status: CP
Start: 2019-09-25 — End: 2020-09-24

## 2019-12-02 DEATH — deceased
